# Patient Record
Sex: Female | Born: 1955 | Race: White | Hispanic: No | Marital: Married | Smoking: Never smoker
Health system: Southern US, Community
[De-identification: ages and names within clinical notes are randomized; demographics above are authoritative.]

## PROBLEM LIST (undated history)

## (undated) DIAGNOSIS — G43909 Migraine, unspecified, not intractable, without status migrainosus: Secondary | ICD-10-CM

## (undated) DIAGNOSIS — G20 Parkinson's disease: Secondary | ICD-10-CM

## (undated) DIAGNOSIS — G20A1 Parkinson's disease without dyskinesia, without mention of fluctuations: Secondary | ICD-10-CM

## (undated) HISTORY — PX: TONSILLECTOMY: SUR1361

## (undated) HISTORY — DX: Parkinson's disease without dyskinesia, without mention of fluctuations: G20.A1

## (undated) HISTORY — DX: Parkinson's disease: G20

## (undated) HISTORY — PX: BASAL CELL CARCINOMA EXCISION: SHX1214

## (undated) HISTORY — PX: APPENDECTOMY: SHX54

## (undated) HISTORY — DX: Migraine, unspecified, not intractable, without status migrainosus: G43.909

---

## 1999-05-28 ENCOUNTER — Other Ambulatory Visit: Admission: RE | Admit: 1999-05-28 | Discharge: 1999-05-28 | Payer: Self-pay | Admitting: Obstetrics and Gynecology

## 2000-05-28 ENCOUNTER — Other Ambulatory Visit: Admission: RE | Admit: 2000-05-28 | Discharge: 2000-05-28 | Payer: Self-pay | Admitting: Obstetrics and Gynecology

## 2000-06-01 ENCOUNTER — Encounter: Payer: Self-pay | Admitting: Obstetrics and Gynecology

## 2000-06-01 ENCOUNTER — Encounter: Admission: RE | Admit: 2000-06-01 | Discharge: 2000-06-01 | Payer: Self-pay | Admitting: Obstetrics and Gynecology

## 2000-06-05 ENCOUNTER — Encounter: Payer: Self-pay | Admitting: Obstetrics and Gynecology

## 2000-06-05 ENCOUNTER — Encounter: Admission: RE | Admit: 2000-06-05 | Discharge: 2000-06-05 | Payer: Self-pay | Admitting: Obstetrics and Gynecology

## 2001-06-07 ENCOUNTER — Other Ambulatory Visit: Admission: RE | Admit: 2001-06-07 | Discharge: 2001-06-07 | Payer: Self-pay | Admitting: Obstetrics and Gynecology

## 2001-06-09 ENCOUNTER — Encounter: Payer: Self-pay | Admitting: Obstetrics and Gynecology

## 2001-06-09 ENCOUNTER — Encounter: Admission: RE | Admit: 2001-06-09 | Discharge: 2001-06-09 | Payer: Self-pay | Admitting: Obstetrics and Gynecology

## 2002-02-23 ENCOUNTER — Encounter: Payer: Self-pay | Admitting: Obstetrics and Gynecology

## 2002-02-23 ENCOUNTER — Encounter: Admission: RE | Admit: 2002-02-23 | Discharge: 2002-02-23 | Payer: Self-pay | Admitting: Obstetrics and Gynecology

## 2003-03-08 ENCOUNTER — Other Ambulatory Visit: Admission: RE | Admit: 2003-03-08 | Discharge: 2003-03-08 | Payer: Self-pay | Admitting: Obstetrics and Gynecology

## 2003-10-18 ENCOUNTER — Encounter: Admission: RE | Admit: 2003-10-18 | Discharge: 2003-10-18 | Payer: Self-pay | Admitting: Obstetrics and Gynecology

## 2004-09-27 ENCOUNTER — Other Ambulatory Visit: Admission: RE | Admit: 2004-09-27 | Discharge: 2004-09-27 | Payer: Self-pay | Admitting: Obstetrics and Gynecology

## 2004-10-18 ENCOUNTER — Encounter: Admission: RE | Admit: 2004-10-18 | Discharge: 2004-10-18 | Payer: Self-pay | Admitting: Obstetrics and Gynecology

## 2005-10-01 ENCOUNTER — Other Ambulatory Visit: Admission: RE | Admit: 2005-10-01 | Discharge: 2005-10-01 | Payer: Self-pay | Admitting: Obstetrics and Gynecology

## 2005-11-18 ENCOUNTER — Encounter: Admission: RE | Admit: 2005-11-18 | Discharge: 2005-11-18 | Payer: Self-pay | Admitting: Obstetrics and Gynecology

## 2005-12-09 ENCOUNTER — Ambulatory Visit: Payer: Self-pay | Admitting: Gastroenterology

## 2005-12-24 ENCOUNTER — Ambulatory Visit: Payer: Self-pay | Admitting: Gastroenterology

## 2006-11-23 ENCOUNTER — Encounter: Admission: RE | Admit: 2006-11-23 | Discharge: 2006-11-23 | Payer: Self-pay | Admitting: Obstetrics and Gynecology

## 2006-11-25 ENCOUNTER — Emergency Department (HOSPITAL_COMMUNITY): Admission: EM | Admit: 2006-11-25 | Discharge: 2006-11-25 | Payer: Self-pay | Admitting: Emergency Medicine

## 2006-12-07 ENCOUNTER — Encounter: Admission: RE | Admit: 2006-12-07 | Discharge: 2006-12-07 | Payer: Self-pay | Admitting: Obstetrics and Gynecology

## 2007-11-25 ENCOUNTER — Encounter: Admission: RE | Admit: 2007-11-25 | Discharge: 2007-11-25 | Payer: Self-pay | Admitting: Obstetrics and Gynecology

## 2007-12-02 ENCOUNTER — Encounter: Admission: RE | Admit: 2007-12-02 | Discharge: 2007-12-02 | Payer: Self-pay | Admitting: Obstetrics and Gynecology

## 2008-11-27 ENCOUNTER — Encounter: Admission: RE | Admit: 2008-11-27 | Discharge: 2008-11-27 | Payer: Self-pay | Admitting: Obstetrics and Gynecology

## 2008-11-30 ENCOUNTER — Encounter: Admission: RE | Admit: 2008-11-30 | Discharge: 2008-11-30 | Payer: Self-pay | Admitting: Obstetrics and Gynecology

## 2010-01-23 ENCOUNTER — Encounter: Admission: RE | Admit: 2010-01-23 | Discharge: 2010-01-23 | Payer: Self-pay | Admitting: Obstetrics and Gynecology

## 2010-11-17 ENCOUNTER — Encounter: Payer: Self-pay | Admitting: Obstetrics and Gynecology

## 2010-12-23 ENCOUNTER — Other Ambulatory Visit: Payer: Self-pay | Admitting: Obstetrics and Gynecology

## 2010-12-23 DIAGNOSIS — Z1231 Encounter for screening mammogram for malignant neoplasm of breast: Secondary | ICD-10-CM

## 2011-02-17 ENCOUNTER — Ambulatory Visit
Admission: RE | Admit: 2011-02-17 | Discharge: 2011-02-17 | Disposition: A | Discharge status: Home / Self Care | Payer: 59 | Source: Ambulatory Visit | Attending: Obstetrics and Gynecology | Admitting: Obstetrics and Gynecology

## 2011-02-17 DIAGNOSIS — Z1231 Encounter for screening mammogram for malignant neoplasm of breast: Secondary | ICD-10-CM

## 2012-01-22 ENCOUNTER — Other Ambulatory Visit: Payer: Self-pay | Admitting: Obstetrics and Gynecology

## 2012-01-22 DIAGNOSIS — Z1231 Encounter for screening mammogram for malignant neoplasm of breast: Secondary | ICD-10-CM

## 2012-04-06 ENCOUNTER — Ambulatory Visit
Admission: RE | Admit: 2012-04-06 | Discharge: 2012-04-06 | Disposition: A | Discharge status: Home / Self Care | Payer: BC Managed Care – PPO | Source: Ambulatory Visit | Attending: Obstetrics and Gynecology | Admitting: Obstetrics and Gynecology

## 2012-04-06 DIAGNOSIS — Z1231 Encounter for screening mammogram for malignant neoplasm of breast: Secondary | ICD-10-CM

## 2015-12-26 ENCOUNTER — Encounter: Payer: Self-pay | Admitting: Internal Medicine

## 2016-02-05 DIAGNOSIS — H2513 Age-related nuclear cataract, bilateral: Secondary | ICD-10-CM | POA: Insufficient documentation

## 2016-07-30 LAB — HIV ANTIBODY (ROUTINE TESTING W REFLEX): HIV: NONREACTIVE

## 2016-09-01 DIAGNOSIS — Z23 Encounter for immunization: Secondary | ICD-10-CM | POA: Diagnosis not present

## 2017-02-17 DIAGNOSIS — H5213 Myopia, bilateral: Secondary | ICD-10-CM | POA: Insufficient documentation

## 2017-02-17 DIAGNOSIS — H524 Presbyopia: Secondary | ICD-10-CM | POA: Insufficient documentation

## 2017-02-17 DIAGNOSIS — H52203 Unspecified astigmatism, bilateral: Secondary | ICD-10-CM | POA: Insufficient documentation

## 2017-09-02 LAB — CBC AND DIFFERENTIAL
HEMATOCRIT: 41 (ref 36–46)
Hemoglobin: 13.3 (ref 12.0–16.0)
NEUTROS ABS: 33
PLATELETS: 242 (ref 150–399)
WBC: 5.1

## 2017-09-02 LAB — BASIC METABOLIC PANEL
BUN: 12 (ref 4–21)
Creatinine: 0.8 (ref 0.5–1.1)
GLUCOSE: 93
POTASSIUM: 4 (ref 3.4–5.3)
SODIUM: 144 (ref 137–147)

## 2017-09-02 LAB — LIPID PANEL
CHOLESTEROL: 256 — AB (ref 0–200)
HDL: 66 (ref 35–70)
LDL CALC: 159
TRIGLYCERIDES: 154 (ref 40–160)

## 2017-09-02 LAB — VITAMIN D 25 HYDROXY (VIT D DEFICIENCY, FRACTURES): Vit D, 25-Hydroxy: 36.5

## 2017-09-02 LAB — HEPATIC FUNCTION PANEL
AST: 19 (ref 13–35)
Alkaline Phosphatase: 72 (ref 25–125)
Bilirubin, Total: 0.5

## 2017-09-02 LAB — TSH: TSH: 3.74 (ref 0.41–5.90)

## 2017-11-02 DIAGNOSIS — Z1231 Encounter for screening mammogram for malignant neoplasm of breast: Secondary | ICD-10-CM | POA: Diagnosis not present

## 2017-11-02 DIAGNOSIS — Z780 Asymptomatic menopausal state: Secondary | ICD-10-CM | POA: Diagnosis not present

## 2017-11-02 LAB — HM MAMMOGRAPHY

## 2017-11-17 DIAGNOSIS — G20 Parkinson's disease: Secondary | ICD-10-CM | POA: Diagnosis not present

## 2018-04-13 ENCOUNTER — Encounter: Payer: Self-pay | Admitting: Neurology

## 2018-05-05 ENCOUNTER — Encounter: Payer: Self-pay | Admitting: Neurology

## 2018-05-11 NOTE — Progress Notes (Signed)
Kristi Stark was seen today in the movement disorders clinic for neurologic consultation at the request of Rolena Infante, MD.  The consultation is for the evaluation of Parkinson's disease.  Records have been reviewed that are available to me.  The patient first saw Dr. Adria Dill on May 21, 2017.  The patient's symptoms started in approximately March, 2018 with left hand tremor and decreased range of motion in the left arm and left shoulder.  She initially saw orthopedics and was diagnosed with a frozen shoulder.  Upon her history and examination with Dr. 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  The patient did not want to be on any medication.  She followed up with Dr. Adria Dill in January, 2019.  She was exercising very faithfully and felt that she was doing much better.   Specific Symptoms:  Tremor: Yes.  , L hand only Family hx of similar:  No. Voice: no change Sleep: sleeping well  Vivid Dreams:  No.  Acting out dreams:  No. Wet Pillows: No. Postural symptoms:  No.  Falls?  No. Bradykinesia symptoms: no bradykinesia noted Loss of smell:   "never been great" Loss of taste:  No. Urinary Incontinence:  No. Difficulty Swallowing:  No. Handwriting, micrographia: No. Trouble with ADL's:  No.  Trouble buttoning clothing: No. Depression:  No. Memory changes:  No. Hallucinations:  No.  visual distortions: No. N/V:  No. Lightheaded:  No.  Syncope: No. Diplopia:  No. Dyskinesia:  No.  Neuroimaging of the brain has not previously been performed.    PREVIOUS MEDICATIONS: none to date  ALLERGIES:  No Known Allergies  CURRENT MEDICATIONS:  No outpatient encounter medications on file as of 05/14/2018.   No facility-administered encounter medications on file as of 05/14/2018.     PAST MEDICAL HISTORY:   Past Medical History:  Diagnosis Date  . Migraine   . Parkinson's disease (HCC)     PAST SURGICAL HISTORY:   Past Surgical  History:  Procedure Laterality Date  . BASAL CELL CARCINOMA EXCISION      SOCIAL HISTORY:   Social History   Socioeconomic History  . Marital status: Married    Spouse name: Not on file  . Number of children: Not on file  . Years of education: Not on file  . Highest education level: Not on file  Occupational History  . Not on file  Social Needs  . Financial resource strain: Not on file  . Food insecurity:    Worry: Not on file    Inability: Not on file  . Transportation needs:    Medical: Not on file    Non-medical: Not on file  Tobacco Use  . Smoking status: Never Smoker  . Smokeless tobacco: Never Used  Substance and Sexual Activity  . Alcohol use: Yes    Comment: wine every 2 weeks  . Drug use: Never  . Sexual activity: Not on file  Lifestyle  . Physical activity:    Days per week: Not on file    Minutes per session: Not on file  . Stress: Not on file  Relationships  . Social connections:    Talks on phone: Not on file    Gets together: Not on file    Attends religious service: Not on file    Active member of club or organization: Not on file    Attends meetings of clubs or organizations: Not on file    Relationship status: Not on file  .  Intimate partner violence:    Fear of current or ex partner: Not on file    Emotionally abused: Not on file    Physically abused: Not on file    Forced sexual activity: Not on file  Other Topics Concern  . Not on file  Social History Narrative  . Not on file    FAMILY HISTORY:   Family Status  Relation Name Status  . Mother  Deceased  . Father  Deceased  . Sister  Alive  . Brother  Alive  . Daughter  Alive  . Son  Alive    ROS:  ROS  PHYSICAL EXAMINATION:    VITALS:   Vitals:   05/14/18 0942  BP: 112/60  Pulse: 60  SpO2: 98%  Weight: 141 lb (64 kg)  Height: 5\' 4"  (1.626 m)    GEN:  The patient appears stated age and is in NAD. HEENT:  Normocephalic, atraumatic.  The mucous membranes are moist. The  superficial temporal arteries are without ropiness or tenderness. CV:  RRR Lungs:  CTAB Neck/HEME:  There are no carotid bruits bilaterally.  Neurological examination:  Orientation: The patient is alert and oriented x3. Fund of knowledge is appropriate.  Recent and remote memory are intact.  Attention and concentration are normal.    Able to name objects and repeat phrases. Cranial nerves: There is good facial symmetry.  There is mild facial hypomimia.  Pupils are equal round and reactive to light bilaterally. Fundoscopic exam reveals clear margins bilaterally. Extraocular muscles are intact. The visual fields are full to confrontational testing. The speech is fluent and clear. Soft palate rises symmetrically and there is no tongue deviation. Hearing is intact to conversational tone. Sensation: Sensation is intact to light and pinprick throughout (facial, trunk, extremities). Vibration is intact at the bilateral big toe. There is no extinction with double simultaneous stimulation. There is no sensory dermatomal level identified. Motor: Strength is 5/5 in the bilateral upper and lower extremities.   Shoulder shrug is equal and symmetric.  There is no pronator drift. Deep tendon reflexes: Deep tendon reflexes are 2/4 at the bilateral biceps, triceps, brachioradialis, patella and achilles. Plantar responses are downgoing bilaterally.  Movement examination: Tone: There is mild increased tone in the left upper extremity.  Tone elsewhere is normal. Abnormal movements: Left upper extremity resting tremor is noted.  This increases with distraction procedures.  There is mouth movement when distracted. Coordination:  There is mild decremation with RAM's, seen with finger taps and hand opening and closing on the left.  All other rapid alternating movements are normal. Gait and Station: The patient has no difficulty arising out of a deep-seated chair without the use of the hands. The patient's stride length is  normal.  The patient has a negative pull test.      ASSESSMENT/PLAN:  1.  Idiopathic Parkinson's disease.  Patient was diagnosed in July, 2018 with symptoms since March, 2018.  -We discussed the diagnosis as well as pathophysiology of the disease.  We discussed treatment options as well as prognostic indicators.  Patient education was provided.  -Greater than 50% of the 60 minute visit was spent in counseling answering questions and talking about what to expect now as well as in the future.  We talked about medication options as well as potential future surgical options.  We talked about safety in the home.  -We discussed various medications.  She really does not want medication right now.  -We discussed community resources in the  area including patient support groups and community exercise programs for PD and pt education was provided to the patient.  I think she would fit and well with thrive community group and we will introduce her to this.  I will have my social worker call her and introduce herself Monday as she was out of the office today.   2.  Hx of BCC  -has appt with [place] [facility].  -We discussed that it used to be believed that levodopa would increased risk of melanoma, but it is now believed that Parkinson's itself likely increases risk of melanoma.  3.  Patient currently has no primary care physician since she has just moved back to the area.  She was given phone numbers and information to current primary care physicians that may be taking new patients.  4.  F/u 5-6 months.  Cc:  Patient, No Pcp Per

## 2018-05-14 ENCOUNTER — Encounter: Payer: Self-pay | Admitting: Neurology

## 2018-05-14 ENCOUNTER — Ambulatory Visit (INDEPENDENT_AMBULATORY_CARE_PROVIDER_SITE_OTHER): Payer: BLUE CROSS/BLUE SHIELD | Admitting: Neurology

## 2018-05-14 VITALS — BP 112/60 | HR 60 | Ht 64.0 in | Wt 141.0 lb

## 2018-05-14 DIAGNOSIS — G20 Parkinson's disease: Secondary | ICD-10-CM | POA: Diagnosis not present

## 2018-05-14 NOTE — Patient Instructions (Addendum)
Neena RhymesKatherine Tabori or Asencion PartridgeCamille Andy can be reached at [phone] Guardian Life InsuranceSteven Hunter [phone] Ruthe Mannanalia Aron [phone]

## 2018-05-17 ENCOUNTER — Telehealth: Payer: Self-pay | Admitting: Psychology

## 2018-05-17 NOTE — Telephone Encounter (Signed)
TC to patient to let her know about the THR[employer] group. She seemed uncomfortable with me passing along her info to [place], so I provided her with information to contact [place] directly.

## 2018-07-01 ENCOUNTER — Ambulatory Visit (INDEPENDENT_AMBULATORY_CARE_PROVIDER_SITE_OTHER): Payer: BLUE CROSS/BLUE SHIELD | Admitting: Family Medicine

## 2018-07-01 ENCOUNTER — Other Ambulatory Visit: Payer: Self-pay

## 2018-07-01 ENCOUNTER — Encounter: Payer: Self-pay | Admitting: Family Medicine

## 2018-07-01 VITALS — BP 110/78 | HR 52 | Temp 98.1°F | Resp 15 | Ht 63.0 in | Wt 141.2 lb

## 2018-07-01 DIAGNOSIS — G43809 Other migraine, not intractable, without status migrainosus: Secondary | ICD-10-CM | POA: Diagnosis not present

## 2018-07-01 DIAGNOSIS — E785 Hyperlipidemia, unspecified: Secondary | ICD-10-CM | POA: Diagnosis not present

## 2018-07-01 DIAGNOSIS — G43909 Migraine, unspecified, not intractable, without status migrainosus: Secondary | ICD-10-CM | POA: Insufficient documentation

## 2018-07-01 DIAGNOSIS — G20 Parkinson's disease: Secondary | ICD-10-CM | POA: Diagnosis not present

## 2018-07-01 NOTE — Assessment & Plan Note (Signed)
New to provider.  Ongoing for pt.  sxs are well controlled w/ Maxalt on the rare occasion they develop.  No need for prophylaxis given that she is only having 2-3/yr.  Will continue to assess this going forward if need be.

## 2018-07-01 NOTE — Progress Notes (Signed)
   Subjective:    Patient ID: Kristi Stark, female    DOB: 12-14-1955, 62 y.o.   MRN: [id number]  HPI New to establish.  Previous MD- [facility] in [place]  Parkinson's- seeing Dr Tat, not currently on medication.  She is exercising regularly and following a healthy diet.  Seeing an acupuncturist. Hoping to delay medication as long as possible.  Tremor isolated to L hand.  R hand dominant.  Migraines- pt having 2-3/year.  Will take Maxalt prn.  Has found acupuncture to be very helpful.  Hyperlipidemia- ongoing issue.  On Red Yeast Rice daily.  Exercising daily- elliptical, treadmill, bike, walking.  Health Maintenance- UTD on colonoscopy (2018), mammo, pap.   Review of Systems For ROS see HPI     Objective:   Physical Exam  Constitutional: She is oriented to person, place, and time. She appears well-developed and well-nourished. No distress.  HENT:  Head: Normocephalic and atraumatic.  Eyes: Pupils are equal, round, and reactive to light. Conjunctivae and EOM are normal.  Neck: Normal range of motion. Neck supple. No thyromegaly present.  Cardiovascular: Normal rate, regular rhythm, normal heart sounds and intact distal pulses.  No murmur heard. Pulmonary/Chest: Effort normal and breath sounds normal. No respiratory distress.  Abdominal: Soft. She exhibits no distension. There is no tenderness.  Musculoskeletal: She exhibits no edema.  Lymphadenopathy:    She has no cervical adenopathy.  Neurological: She is alert and oriented to person, place, and time.  Fine, pill rolling tremor of L hand  Skin: Skin is warm and dry.  Psychiatric: She has a normal mood and affect. Her behavior is normal.  Vitals reviewed.         Assessment & Plan:

## 2018-07-01 NOTE — Assessment & Plan Note (Signed)
New to provider, ongoing for pt.  On Red Yeast Rice daily.  Exercising regularly.  Applauded her efforts.  Check labs.  Adjust meds prn

## 2018-07-01 NOTE — Patient Instructions (Signed)
Schedule your complete physical in 6 months We'll notify you of your lab results and make any changes if needed Keep up the good work on healthy diet and regular exercise- you look great!!! Call with any questions or concerns Welcome!  We're glad to have you!!! 

## 2018-07-01 NOTE — Assessment & Plan Note (Signed)
New to provider, ongoing for pt.  Not currently on any medication.  Following w/ Dr Arbutus Leas.  Will continue to follow along.

## 2018-07-02 LAB — HEPATIC FUNCTION PANEL
ALBUMIN: 4.6 g/dL (ref 3.5–5.2)
ALT: 15 U/L (ref 0–35)
AST: 23 U/L (ref 0–37)
Alkaline Phosphatase: 82 U/L (ref 39–117)
Bilirubin, Direct: 0.1 mg/dL (ref 0.0–0.3)
TOTAL PROTEIN: 7 g/dL (ref 6.0–8.3)
Total Bilirubin: 0.5 mg/dL (ref 0.2–1.2)

## 2018-07-02 LAB — CBC WITH DIFFERENTIAL/PLATELET
BASOS ABS: 0 10*3/uL (ref 0.0–0.1)
Basophils Relative: 0.4 % (ref 0.0–3.0)
EOS PCT: 3.4 % (ref 0.0–5.0)
Eosinophils Absolute: 0.2 10*3/uL (ref 0.0–0.7)
HCT: 38.8 % (ref 36.0–46.0)
HEMOGLOBIN: 13.3 g/dL (ref 12.0–15.0)
Lymphocytes Relative: 49.4 % — ABNORMAL HIGH (ref 12.0–46.0)
Lymphs Abs: 3.4 10*3/uL (ref 0.7–4.0)
MCHC: 34.3 g/dL (ref 30.0–36.0)
MCV: 94.3 fl (ref 78.0–100.0)
MONO ABS: 0.5 10*3/uL (ref 0.1–1.0)
Monocytes Relative: 7.4 % (ref 3.0–12.0)
Neutro Abs: 2.7 10*3/uL (ref 1.4–7.7)
Neutrophils Relative %: 39.4 % — ABNORMAL LOW (ref 43.0–77.0)
Platelets: 224 10*3/uL (ref 150.0–400.0)
RBC: 4.12 Mil/uL (ref 3.87–5.11)
RDW: 12.4 % (ref 11.5–15.5)
WBC: 6.9 10*3/uL (ref 4.0–10.5)

## 2018-07-02 LAB — LIPID PANEL
CHOL/HDL RATIO: 3
CHOLESTEROL: 242 mg/dL — AB (ref 0–200)
HDL: 78.2 mg/dL (ref >39.00)
NonHDL: 163.81
TRIGLYCERIDES: 347 mg/dL — AB (ref 0.0–149.0)
VLDL: 69.4 mg/dL — AB (ref 0.0–40.0)

## 2018-07-02 LAB — BASIC METABOLIC PANEL
BUN: 18 mg/dL (ref 6–23)
CO2: 30 mEq/L (ref 19–32)
CREATININE: 0.84 mg/dL (ref 0.40–1.20)
Calcium: 10 mg/dL (ref 8.4–10.5)
Chloride: 105 mEq/L (ref 96–112)
GFR: 72.79 mL/min (ref >60.00)
Glucose, Bld: 93 mg/dL (ref 70–99)
Potassium: 4.1 mEq/L (ref 3.5–5.1)
Sodium: 143 mEq/L (ref 135–145)

## 2018-07-02 LAB — LDL CHOLESTEROL, DIRECT: LDL DIRECT: 140 mg/dL

## 2018-07-02 LAB — TSH: TSH: 3.02 u[IU]/mL (ref 0.35–4.50)

## 2018-08-03 ENCOUNTER — Encounter: Payer: Self-pay | Admitting: General Practice

## 2018-08-11 ENCOUNTER — Ambulatory Visit (INDEPENDENT_AMBULATORY_CARE_PROVIDER_SITE_OTHER): Payer: BLUE CROSS/BLUE SHIELD

## 2018-08-11 DIAGNOSIS — Z23 Encounter for immunization: Secondary | ICD-10-CM | POA: Diagnosis not present

## 2018-10-12 NOTE — Progress Notes (Signed)
Kristi Stark was seen today in the movement disorders clinic for follow up of Parkinson's disease.  She is accompanied by her husband who supplements the history.  She is on no medication, which is by her choice.    She reports that she has been stable since our last visit.  She has had no falls.  No lightheadedness or near syncope.  Her mood has been good.  Records are reviewed since our last visit.  She is now seeing Dr. Beverely Lowabori.  I have reviewed those records.  [facility].  She [facility] [employer] and she loves that.  She also is [facility] class.   PREVIOUS MEDICATIONS: none to date  ALLERGIES:  No Known Allergies  CURRENT MEDICATIONS:  Outpatient Encounter Medications as of 10/14/2018  Medication Sig  . Acetylcysteine (NAC PO) Take by mouth daily.  . Medium Chain Triglycerides (MCT OIL PO) Take by mouth every other day.  . Multiple Vitamins-Minerals (WOMENS MULTIVITAMIN PO) Take by mouth daily.  . Omega-3 1000 MG CAPS Take by mouth daily.  . Probiotic Product (PROBIOTIC-10 PO) Take by mouth daily.  . Red Yeast Rice Extract (RED YEAST RICE PO) Take by mouth daily.  . rizatriptan (MAXALT-MLT) 5 MG disintegrating tablet PRN   No facility-administered encounter medications on file as of 10/14/2018.     PAST MEDICAL HISTORY:   Past Medical History:  Diagnosis Date  . Migraine   . Parkinson's disease (HCC)     PAST SURGICAL HISTORY:   Past Surgical History:  Procedure Laterality Date  . APPENDECTOMY    . BASAL CELL CARCINOMA EXCISION    . TONSILLECTOMY      SOCIAL HISTORY:   Social History   Socioeconomic History  . Marital status: Married    Spouse name: Not on file  . Number of children: Not on file  . Years of education: Not on file  . Highest education level: Not on file  Occupational History  . Not on file  Social Needs  . Financial resource strain: Not on file   . Food insecurity:    Worry: Not on file    Inability: Not on file  . Transportation needs:    Medical: Not on file    Non-medical: Not on file  Tobacco Use  . Smoking status: Never Smoker  . Smokeless tobacco: Never Used  Substance and Sexual Activity  . Alcohol use: Yes    Comment: wine every 2 weeks  . Drug use: Never  . Sexual activity: Yes  Lifestyle  . Physical activity:    Days per week: Not on file    Minutes per session: Not on file  . Stress: Not on file  Relationships  . Social connections:    Talks on phone: Not on file    Gets together: Not on file    Attends religious service: Not on file    Active member of club or organization: Not on file    Attends meetings of clubs or organizations: Not on file    Relationship status: Not on file  . Intimate partner violence:    Fear of current or ex partner: Not on file    Emotionally abused: Not on file    Physically abused: Not on file    Forced sexual activity: Not on file  Other Topics Concern  . Not on file  Social History Narrative  .  Not on file    FAMILY HISTORY:   Family Status  Relation Name Status  . Mother  Deceased  . Father  Deceased  . Sister  Alive  . Brother  Alive  . Daughter  Alive  . Son  Alive    ROS:  Review of Systems  Constitutional: Negative.   HENT: Negative.   Eyes: Negative.   Respiratory: Negative.   Cardiovascular: Negative.   Gastrointestinal: Negative.   Genitourinary: Negative.   Musculoskeletal: Negative.   Skin: Negative.     PHYSICAL EXAMINATION:    VITALS:   Vitals:   10/14/18 1419  BP: 118/62  Pulse: 64  SpO2: 99%  Weight: 142 lb (64.4 kg)  Height: 5' 3.5" (1.613 m)    GEN:  The patient appears stated age and is in NAD. HEENT:  Normocephalic, atraumatic.  The mucous membranes are moist. The superficial temporal arteries are without ropiness or tenderness. CV:  RRR Lungs:  CTAB Neck/HEME:  There are no carotid bruits bilaterally.  Neurological  examination:  Orientation: The patient is alert and oriented x3. Cranial nerves: There is good facial symmetry. The speech is fluent and clear. Soft palate rises symmetrically and there is no tongue deviation. Hearing is intact to conversational tone. Sensation: Sensation is intact to light touch throughout Motor: Strength is 5/5 in the bilateral upper and lower extremities.   Shoulder shrug is equal and symmetric.  There is no pronator drift.  Movement examination: Tone: There is mild increased tone with activation procedures in the left upper extremity Abnormal movements: Left upper extremity resting tremor is noted.  This increases with distraction procedures.   Coordination:  There is mild decremation with RAM's, seen with finger taps and toe taps on the left. Gait and Station: The patient has no difficulty arising out of a deep-seated chair without the use of the hands. The patient's stride length is normal.  The patient has a negative pull test.        Chemistry      Component Value Date/Time   NA 143 07/01/2018 1611   NA 144 09/02/2017   K 4.1 07/01/2018 1611   CL 105 07/01/2018 1611   CO2 30 07/01/2018 1611   BUN 18 07/01/2018 1611   BUN 12 09/02/2017   CREATININE 0.84 07/01/2018 1611   GLU 93 09/02/2017      Component Value Date/Time   CALCIUM 10.0 07/01/2018 1611   ALKPHOS 82 07/01/2018 1611   AST 23 07/01/2018 1611   ALT 15 07/01/2018 1611   BILITOT 0.5 07/01/2018 1611     Lab Results  Component Value Date   TSH 3.02 07/01/2018     ASSESSMENT/PLAN:  1.  Idiopathic Parkinson's disease.  Patient was diagnosed in July, 2018 with symptoms since March, 2018.  -We discussed the diagnosis as well as pathophysiology of the disease.  We discussed treatment options as well as prognostic indicators.  Patient education was provided.  -She is currently participating in the [facility] group as well as the [employer] cycle class and I congratulated her.  -We discussed various  medications, but ultimately decided to hold off on those given that she is doing well.   2.  Hx of BCC  -has appt with [place] [facility] in January.  -We discussed that it used to be believed that levodopa would increased risk of melanoma, but it is now believed that Parkinson's itself likely increases risk of melanoma.  3.  I will see her back in 6 months,  sooner should new neurologic issues arise.  Cc:  Sheliah Hatch, MD

## 2018-10-14 ENCOUNTER — Ambulatory Visit (INDEPENDENT_AMBULATORY_CARE_PROVIDER_SITE_OTHER): Payer: BLUE CROSS/BLUE SHIELD | Admitting: Neurology

## 2018-10-14 ENCOUNTER — Encounter: Payer: Self-pay | Admitting: Neurology

## 2018-10-14 VITALS — BP 118/62 | HR 64 | Ht 63.5 in | Wt 142.0 lb

## 2018-10-14 DIAGNOSIS — G20 Parkinson's disease: Secondary | ICD-10-CM

## 2018-11-03 DIAGNOSIS — D225 Melanocytic nevi of trunk: Secondary | ICD-10-CM | POA: Diagnosis not present

## 2018-11-03 DIAGNOSIS — D2239 Melanocytic nevi of other parts of face: Secondary | ICD-10-CM | POA: Diagnosis not present

## 2018-11-03 DIAGNOSIS — D2261 Melanocytic nevi of right upper limb, including shoulder: Secondary | ICD-10-CM | POA: Diagnosis not present

## 2018-11-03 DIAGNOSIS — D2262 Melanocytic nevi of left upper limb, including shoulder: Secondary | ICD-10-CM | POA: Diagnosis not present

## 2018-12-28 ENCOUNTER — Encounter: Payer: Self-pay | Admitting: Family Medicine

## 2018-12-28 ENCOUNTER — Ambulatory Visit (INDEPENDENT_AMBULATORY_CARE_PROVIDER_SITE_OTHER): Payer: BLUE CROSS/BLUE SHIELD | Admitting: Family Medicine

## 2018-12-28 ENCOUNTER — Other Ambulatory Visit: Payer: Self-pay

## 2018-12-28 VITALS — BP 108/62 | HR 56 | Temp 98.3°F | Resp 14 | Ht 64.0 in | Wt 140.4 lb

## 2018-12-28 DIAGNOSIS — Z23 Encounter for immunization: Secondary | ICD-10-CM | POA: Diagnosis not present

## 2018-12-28 DIAGNOSIS — Z1231 Encounter for screening mammogram for malignant neoplasm of breast: Secondary | ICD-10-CM

## 2018-12-28 DIAGNOSIS — G20A1 Parkinson's disease without dyskinesia, without mention of fluctuations: Secondary | ICD-10-CM

## 2018-12-28 DIAGNOSIS — G20 Parkinson's disease: Secondary | ICD-10-CM

## 2018-12-28 DIAGNOSIS — Z0000 Encounter for general adult medical examination without abnormal findings: Secondary | ICD-10-CM

## 2018-12-28 DIAGNOSIS — E785 Hyperlipidemia, unspecified: Secondary | ICD-10-CM | POA: Diagnosis not present

## 2018-12-28 NOTE — Progress Notes (Signed)
   Subjective:    Patient ID: Kristi Stark, female    DOB: 09-Aug-1956, 63 y.o.   MRN: [id number]  HPI CPE- UTD on colonoscopy, pap, flu shot.  Due for Tdap.  Due for mammo.  'I feel great'   Review of Systems Patient reports no vision/ hearing changes, adenopathy,fever, weight change,  persistant/recurrent hoarseness , swallowing issues, chest pain, palpitations, edema, persistant/recurrent cough, hemoptysis, dyspnea (rest/exertional/paroxysmal nocturnal), gastrointestinal bleeding (melena, rectal bleeding), abdominal pain, significant heartburn, bowel changes, GU symptoms (dysuria, hematuria, incontinence), Gyn symptoms (abnormal  bleeding, pain),  syncope, focal weakness, memory loss, numbness & tingling, skin/hair/nail changes, abnormal bruising or bleeding, anxiety, or depression.     Objective:   Physical Exam General Appearance:    Alert, cooperative, no distress, appears stated age  Head:    Normocephalic, without obvious abnormality, atraumatic  Eyes:    PERRL, conjunctiva/corneas clear, EOM's intact, fundi    benign, both eyes  Ears:    Normal TM's and external ear canals, both ears  Nose:   Nares normal, septum midline, mucosa normal, no drainage    or sinus tenderness  Throat:   Lips, mucosa, and tongue normal; teeth and gums normal  Neck:   Supple, symmetrical, trachea midline, no adenopathy;    Thyroid: no enlargement/tenderness/nodules  Back:     Symmetric, no curvature, ROM normal, no CVA tenderness  Lungs:     Clear to auscultation bilaterally, respirations unlabored  Chest Wall:    No tenderness or deformity   Heart:    Regular rate and rhythm, S1 and S2 normal, no murmur, rub   or gallop  Breast Exam:    Deferred to mammo  Abdomen:     Soft, non-tender, bowel sounds active all four quadrants,    no masses, no organomegaly  Genitalia:    Deferred to GYN  Rectal:    Extremities:   Extremities normal, atraumatic, no cyanosis or edema  Pulses:   2+ and symmetric all  extremities  Skin:   Skin color, texture, turgor normal, no rashes or lesions  Lymph nodes:   Cervical, supraclavicular, and axillary nodes normal  Neurologic:   CNII-XII intact, normal strength, sensation and reflexes    throughout          Assessment & Plan:

## 2018-12-28 NOTE — Addendum Note (Signed)
Addended by: Solon Augusta on: 12/28/2018 08:41 AM   Modules accepted: Orders

## 2018-12-28 NOTE — Assessment & Plan Note (Signed)
Chronic problem.  On Red Yeast Rice daily.  Applauded efforts at diet and exercise.  Check labs.  Adjust meds prn

## 2018-12-28 NOTE — Addendum Note (Signed)
Addended by: Laddie Aquas A on: 12/28/2018 11:57 AM   Modules accepted: Orders

## 2018-12-28 NOTE — Patient Instructions (Signed)
Follow up in 1 year or as needed We'll notify you of your lab results and make any changes if needed Keep up the good work on healthy diet and regular exercise- you look great!! We'll call you to schedule your mammo Call with any questions or concerns ENJOY YOUR TRIP!!!

## 2018-12-28 NOTE — Assessment & Plan Note (Signed)
Pt's PE WNL.  UTD on pap, colonoscopy.  Due for mammo- ordered.  Tdap given.  Check labs.  Anticipatory guidance provided.

## 2018-12-28 NOTE — Assessment & Plan Note (Signed)
Following w/ Dr Tat 

## 2018-12-29 LAB — CBC WITH DIFFERENTIAL/PLATELET
ABSOLUTE MONOCYTES: 360 {cells}/uL (ref 200–950)
BASOS PCT: 1 %
Basophils Absolute: 59 cells/uL (ref 0–200)
Eosinophils Absolute: 466 cells/uL (ref 15–500)
Eosinophils Relative: 7.9 %
HCT: 38.4 % (ref 35.0–45.0)
Hemoglobin: 13.2 g/dL (ref 11.7–15.5)
Lymphs Abs: 2643 cells/uL (ref 850–3900)
MCH: 32 pg (ref 27.0–33.0)
MCHC: 34.4 g/dL (ref 32.0–36.0)
MCV: 93 fL (ref 80.0–100.0)
MPV: 12.1 fL (ref 7.5–12.5)
Monocytes Relative: 6.1 %
Neutro Abs: 2372 cells/uL (ref 1500–7800)
Neutrophils Relative %: 40.2 %
Platelets: 238 10*3/uL (ref 140–400)
RBC: 4.13 10*6/uL (ref 3.80–5.10)
RDW: 11.4 % (ref 11.0–15.0)
Total Lymphocyte: 44.8 %
WBC: 5.9 10*3/uL (ref 3.8–10.8)

## 2018-12-29 LAB — HEPATIC FUNCTION PANEL
AG RATIO: 2 (calc) (ref 1.0–2.5)
ALT: 11 U/L (ref 6–29)
AST: 21 U/L (ref 10–35)
Albumin: 4.5 g/dL (ref 3.6–5.1)
Alkaline phosphatase (APISO): 70 U/L (ref 37–153)
Bilirubin, Direct: 0.1 mg/dL (ref 0.0–0.2)
GLOBULIN: 2.2 g/dL (ref 1.9–3.7)
Indirect Bilirubin: 0.7 mg/dL (calc) (ref 0.2–1.2)
Total Bilirubin: 0.8 mg/dL (ref 0.2–1.2)
Total Protein: 6.7 g/dL (ref 6.1–8.1)

## 2018-12-29 LAB — BASIC METABOLIC PANEL
BUN: 11 mg/dL (ref 7–25)
CO2: 25 mmol/L (ref 20–32)
CREATININE: 0.88 mg/dL (ref 0.50–0.99)
Calcium: 9.4 mg/dL (ref 8.6–10.4)
Chloride: 109 mmol/L (ref 98–110)
Glucose, Bld: 92 mg/dL (ref 65–99)
Potassium: 3.8 mmol/L (ref 3.5–5.3)
Sodium: 144 mmol/L (ref 135–146)

## 2018-12-29 LAB — TSH: TSH: 3.88 mIU/L (ref 0.40–4.50)

## 2018-12-29 LAB — LIPID PANEL
CHOL/HDL RATIO: 3 (calc) (ref <5.0)
Cholesterol: 228 mg/dL — ABNORMAL HIGH (ref <200)
HDL: 75 mg/dL (ref >=50)
LDL CHOLESTEROL (CALC): 131 mg/dL — AB
NON-HDL CHOLESTEROL (CALC): 153 mg/dL — AB (ref <130)
Triglycerides: 113 mg/dL (ref <150)

## 2018-12-30 ENCOUNTER — Encounter: Payer: Self-pay | Admitting: General Practice

## 2019-02-01 ENCOUNTER — Ambulatory Visit: Payer: BLUE CROSS/BLUE SHIELD

## 2019-03-09 DIAGNOSIS — H04123 Dry eye syndrome of bilateral lacrimal glands: Secondary | ICD-10-CM | POA: Diagnosis not present

## 2019-03-10 ENCOUNTER — Ambulatory Visit
Admission: RE | Admit: 2019-03-10 | Discharge: 2019-03-10 | Disposition: A | Discharge status: Home / Self Care | Payer: BLUE CROSS/BLUE SHIELD | Source: Ambulatory Visit | Attending: Family Medicine | Admitting: Family Medicine

## 2019-03-10 ENCOUNTER — Other Ambulatory Visit: Payer: Self-pay

## 2019-03-10 DIAGNOSIS — Z1231 Encounter for screening mammogram for malignant neoplasm of breast: Secondary | ICD-10-CM | POA: Diagnosis not present

## 2019-03-14 ENCOUNTER — Other Ambulatory Visit: Payer: Self-pay | Admitting: Family Medicine

## 2019-03-14 DIAGNOSIS — N6489 Other specified disorders of breast: Secondary | ICD-10-CM

## 2019-03-16 ENCOUNTER — Ambulatory Visit: Payer: BLUE CROSS/BLUE SHIELD

## 2019-04-01 ENCOUNTER — Ambulatory Visit: Payer: BLUE CROSS/BLUE SHIELD

## 2019-04-01 ENCOUNTER — Other Ambulatory Visit: Payer: Self-pay

## 2019-04-01 ENCOUNTER — Ambulatory Visit
Admission: RE | Admit: 2019-04-01 | Discharge: 2019-04-01 | Disposition: A | Discharge status: Home / Self Care | Payer: BC Managed Care – PPO | Source: Ambulatory Visit | Attending: Family Medicine | Admitting: Family Medicine

## 2019-04-01 ENCOUNTER — Encounter: Payer: Self-pay | Admitting: Neurology

## 2019-04-01 DIAGNOSIS — N6489 Other specified disorders of breast: Secondary | ICD-10-CM

## 2019-04-01 DIAGNOSIS — R922 Inconclusive mammogram: Secondary | ICD-10-CM | POA: Diagnosis not present

## 2019-04-15 ENCOUNTER — Ambulatory Visit: Payer: BLUE CROSS/BLUE SHIELD | Admitting: Neurology

## 2019-10-18 ENCOUNTER — Ambulatory Visit: Payer: BC Managed Care – PPO | Admitting: Neurology

## 2019-10-29 DIAGNOSIS — Z20828 Contact with and (suspected) exposure to other viral communicable diseases: Secondary | ICD-10-CM | POA: Diagnosis not present

## 2019-10-29 DIAGNOSIS — U071 COVID-19: Secondary | ICD-10-CM | POA: Diagnosis not present

## 2019-10-29 DIAGNOSIS — Z03818 Encounter for observation for suspected exposure to other biological agents ruled out: Secondary | ICD-10-CM | POA: Diagnosis not present

## 2019-11-06 DIAGNOSIS — Z20828 Contact with and (suspected) exposure to other viral communicable diseases: Secondary | ICD-10-CM | POA: Diagnosis not present

## 2019-11-06 DIAGNOSIS — U071 COVID-19: Secondary | ICD-10-CM | POA: Diagnosis not present

## 2019-11-13 DIAGNOSIS — Z20828 Contact with and (suspected) exposure to other viral communicable diseases: Secondary | ICD-10-CM | POA: Diagnosis not present

## 2019-11-13 DIAGNOSIS — Z03818 Encounter for observation for suspected exposure to other biological agents ruled out: Secondary | ICD-10-CM | POA: Diagnosis not present

## 2019-12-29 ENCOUNTER — Other Ambulatory Visit: Payer: Self-pay

## 2019-12-29 ENCOUNTER — Ambulatory Visit (INDEPENDENT_AMBULATORY_CARE_PROVIDER_SITE_OTHER): Payer: BC Managed Care – PPO | Admitting: Family Medicine

## 2019-12-29 ENCOUNTER — Encounter: Payer: Self-pay | Admitting: Family Medicine

## 2019-12-29 VITALS — BP 126/82 | HR 56 | Temp 97.8°F | Resp 16 | Ht 64.0 in | Wt 135.4 lb

## 2019-12-29 DIAGNOSIS — G20 Parkinson's disease: Secondary | ICD-10-CM | POA: Diagnosis not present

## 2019-12-29 DIAGNOSIS — Z0000 Encounter for general adult medical examination without abnormal findings: Secondary | ICD-10-CM | POA: Diagnosis not present

## 2019-12-29 DIAGNOSIS — E785 Hyperlipidemia, unspecified: Secondary | ICD-10-CM | POA: Diagnosis not present

## 2019-12-29 DIAGNOSIS — Z8616 Personal history of COVID-19: Secondary | ICD-10-CM

## 2019-12-29 LAB — CBC WITH DIFFERENTIAL/PLATELET
Basophils Absolute: 0.1 10*3/uL (ref 0.0–0.1)
Basophils Relative: 1 % (ref 0.0–3.0)
Eosinophils Absolute: 0.3 10*3/uL (ref 0.0–0.7)
Eosinophils Relative: 6 % — ABNORMAL HIGH (ref 0.0–5.0)
HCT: 41.1 % (ref 36.0–46.0)
Hemoglobin: 13.9 g/dL (ref 12.0–15.0)
Lymphocytes Relative: 44.7 % (ref 12.0–46.0)
Lymphs Abs: 2.5 10*3/uL (ref 0.7–4.0)
MCHC: 33.8 g/dL (ref 30.0–36.0)
MCV: 94.9 fl (ref 78.0–100.0)
Monocytes Absolute: 0.4 10*3/uL (ref 0.1–1.0)
Monocytes Relative: 7.9 % (ref 3.0–12.0)
Neutro Abs: 2.2 10*3/uL (ref 1.4–7.7)
Neutrophils Relative %: 40.4 % — ABNORMAL LOW (ref 43.0–77.0)
Platelets: 223 10*3/uL (ref 150.0–400.0)
RBC: 4.33 Mil/uL (ref 3.87–5.11)
RDW: 12.5 % (ref 11.5–15.5)
WBC: 5.5 10*3/uL (ref 4.0–10.5)

## 2019-12-29 LAB — HEPATIC FUNCTION PANEL
ALT: 15 U/L (ref 0–35)
AST: 26 U/L (ref 0–37)
Albumin: 4.4 g/dL (ref 3.5–5.2)
Alkaline Phosphatase: 74 U/L (ref 39–117)
Bilirubin, Direct: 0.1 mg/dL (ref 0.0–0.3)
Total Bilirubin: 0.8 mg/dL (ref 0.2–1.2)
Total Protein: 7.1 g/dL (ref 6.0–8.3)

## 2019-12-29 LAB — BASIC METABOLIC PANEL
BUN: 16 mg/dL (ref 6–23)
CO2: 28 mEq/L (ref 19–32)
Calcium: 10 mg/dL (ref 8.4–10.5)
Chloride: 105 mEq/L (ref 96–112)
Creatinine, Ser: 0.85 mg/dL (ref 0.40–1.20)
GFR: 67.23 mL/min (ref >60.00)
Glucose, Bld: 90 mg/dL (ref 70–99)
Potassium: 3.8 mEq/L (ref 3.5–5.1)
Sodium: 142 mEq/L (ref 135–145)

## 2019-12-29 LAB — LIPID PANEL
Cholesterol: 261 mg/dL — ABNORMAL HIGH (ref 0–200)
HDL: 67.1 mg/dL (ref >39.00)
LDL Cholesterol: 166 mg/dL — ABNORMAL HIGH (ref 0–99)
NonHDL: 193.76
Total CHOL/HDL Ratio: 4
Triglycerides: 139 mg/dL (ref 0.0–149.0)
VLDL: 27.8 mg/dL (ref 0.0–40.0)

## 2019-12-29 LAB — TSH: TSH: 3.44 u[IU]/mL (ref 0.35–4.50)

## 2019-12-29 NOTE — Assessment & Plan Note (Signed)
Chronic problem, controlling w/ healthy diet and regular exercise.  Check labs and start meds prn.

## 2019-12-29 NOTE — Assessment & Plan Note (Signed)
Chronic problem, following w/ Dr Tat.

## 2019-12-29 NOTE — Patient Instructions (Signed)
Follow up in 1 year or as needed We'll notify you of your lab results and make any changes if needed Keep up the good work!  You look great! Call with any questions or concerns Stay Safe!  Stay Healthy!!! 

## 2019-12-29 NOTE — Assessment & Plan Note (Signed)
Pt has recovered fully

## 2019-12-29 NOTE — Assessment & Plan Note (Signed)
Pt's PE unchanged from previous.  UTD on pap, mammo, colonoscopy, Tdap.  Check labs.  Anticipatory guidance provided.

## 2019-12-29 NOTE — Progress Notes (Signed)
   Subjective:    Patient ID: Kristi Stark, female    DOB: Nov 27, 1955, 64 y.o.   MRN: [id number]  HPI CPE- UTD on pap, mammo, colonoscopy.  UTD on Tdap.  Declined flu   Review of Systems Patient reports no vision/ hearing changes, adenopathy,fever, weight change,  persistant/recurrent hoarseness , swallowing issues, chest pain, palpitations, edema, persistant/recurrent cough, hemoptysis, dyspnea (rest/exertional/paroxysmal nocturnal), gastrointestinal bleeding (melena, rectal bleeding), abdominal pain, significant heartburn, bowel changes, GU symptoms (dysuria, hematuria, incontinence), Gyn symptoms (abnormal  bleeding, pain),  syncope, focal weakness, memory loss, numbness & tingling, skin/hair/nail changes, abnormal bruising or bleeding, anxiety, or depression.   This visit occurred during the SARS-CoV-2 public health emergency.  Safety protocols were in place, including screening questions prior to the visit, additional usage of staff PPE, and extensive cleaning of exam room while observing appropriate contact time as indicated for disinfecting solutions.       Objective:   Physical Exam General Appearance:    Alert, cooperative, no distress, appears stated age  Head:    Normocephalic, without obvious abnormality, atraumatic  Eyes:    PERRL, conjunctiva/corneas clear, EOM's intact, fundi    benign, both eyes  Ears:    Normal TM's and external ear canals, both ears  Nose:   Nares normal, septum midline, mucosa normal, no drainage    or sinus tenderness  Throat:   Lips, mucosa, and tongue normal; teeth and gums normal  Neck:   Supple, symmetrical, trachea midline, no adenopathy;    Thyroid: no enlargement/tenderness/nodules  Back:     Symmetric, no curvature, ROM normal, no CVA tenderness  Lungs:     Clear to auscultation bilaterally, respirations unlabored  Chest Wall:    No tenderness or deformity   Heart:    Regular rate and rhythm, S1 and S2 normal, no murmur, rub   or gallop   Breast Exam:    Deferred to   Abdomen:     Soft, non-tender, bowel sounds active all four quadrants,    no masses, no organomegaly  Genitalia:    Deferred to GYN  Rectal:    Extremities:   Extremities normal, atraumatic, no cyanosis or edema  Pulses:   2+ and symmetric all extremities  Skin:   Skin color, texture, turgor normal, no rashes or lesions  Lymph nodes:   Cervical, supraclavicular, and axillary nodes normal  Neurologic:   CNII-XII intact, normal strength, sensation and reflexes    Throughout.  + tremor of hands          Assessment & Plan:

## 2020-01-09 ENCOUNTER — Ambulatory Visit: Payer: BC Managed Care – PPO | Attending: Internal Medicine

## 2020-01-09 DIAGNOSIS — Z23 Encounter for immunization: Secondary | ICD-10-CM

## 2020-01-09 NOTE — Progress Notes (Signed)
   Covid-19 Vaccination Clinic  Name:  ZI SEK    MRN: [id number] DOB: 1956-09-15  01/09/2020  Ms. Schrader was observed post Covid-19 immunization for 15 minutes without incident. She was provided with Vaccine Information Sheet and instruction to access the V-Safe system.   Ms. Dinning was instructed to call 911 with any severe reactions post vaccine: Marland Kitchen Difficulty breathing  . Swelling of face and throat  . A fast heartbeat  . A bad rash all over body  . Dizziness and weakness   Immunizations Administered    Name Date Dose VIS Date Route   Pfizer COVID-19 Vaccine 01/09/2020  2:23 PM 0.3 mL 10/07/2019 Intramuscular   Manufacturer: [employer], [employer]   Lot: [id number]   NDC: [address]-2

## 2020-01-12 DIAGNOSIS — D225 Melanocytic nevi of trunk: Secondary | ICD-10-CM | POA: Diagnosis not present

## 2020-01-12 DIAGNOSIS — D2262 Melanocytic nevi of left upper limb, including shoulder: Secondary | ICD-10-CM | POA: Diagnosis not present

## 2020-01-12 DIAGNOSIS — L720 Epidermal cyst: Secondary | ICD-10-CM | POA: Diagnosis not present

## 2020-01-12 DIAGNOSIS — D224 Melanocytic nevi of scalp and neck: Secondary | ICD-10-CM | POA: Diagnosis not present

## 2020-01-31 ENCOUNTER — Ambulatory Visit: Payer: BC Managed Care – PPO

## 2020-01-31 ENCOUNTER — Ambulatory Visit: Payer: BC Managed Care – PPO | Attending: Internal Medicine

## 2020-01-31 DIAGNOSIS — Z23 Encounter for immunization: Secondary | ICD-10-CM

## 2020-01-31 NOTE — Progress Notes (Signed)
   Covid-19 Vaccination Clinic  Name:  Kristi Stark    MRN: [id number] DOB: 06/24/1956  01/31/2020  Kristi Stark was observed post Covid-19 immunization for 15 minutes without incident. She was provided with Vaccine Information Sheet and instruction to access the V-Safe system.   Kristi Stark was instructed to call 911 with any severe reactions post vaccine: Marland Kitchen Difficulty breathing  . Swelling of face and throat  . A fast heartbeat  . A bad rash all over body  . Dizziness and weakness   Immunizations Administered    Name Date Dose VIS Date Route   Pfizer COVID-19 Vaccine 01/31/2020  4:24 PM 0.3 mL 10/07/2019 Intramuscular   Manufacturer: [employer], [employer]   Lot: [id number]   NDC: [address]-5

## 2020-03-08 ENCOUNTER — Other Ambulatory Visit: Payer: Self-pay | Admitting: Family Medicine

## 2020-03-08 DIAGNOSIS — Z1231 Encounter for screening mammogram for malignant neoplasm of breast: Secondary | ICD-10-CM

## 2020-04-09 ENCOUNTER — Other Ambulatory Visit: Payer: Self-pay

## 2020-04-09 ENCOUNTER — Ambulatory Visit
Admission: RE | Admit: 2020-04-09 | Discharge: 2020-04-09 | Disposition: A | Discharge status: Home / Self Care | Payer: BC Managed Care – PPO | Source: Ambulatory Visit | Attending: Family Medicine | Admitting: Family Medicine

## 2020-04-09 DIAGNOSIS — Z1231 Encounter for screening mammogram for malignant neoplasm of breast: Secondary | ICD-10-CM

## 2020-04-17 ENCOUNTER — Ambulatory Visit: Payer: BC Managed Care – PPO | Admitting: Neurology

## 2020-06-06 DIAGNOSIS — H524 Presbyopia: Secondary | ICD-10-CM | POA: Diagnosis not present

## 2020-06-06 DIAGNOSIS — H2513 Age-related nuclear cataract, bilateral: Secondary | ICD-10-CM | POA: Diagnosis not present

## 2020-08-09 ENCOUNTER — Ambulatory Visit (INDEPENDENT_AMBULATORY_CARE_PROVIDER_SITE_OTHER): Payer: Medicare Other

## 2020-08-09 ENCOUNTER — Other Ambulatory Visit: Payer: Self-pay

## 2020-08-09 DIAGNOSIS — Z23 Encounter for immunization: Secondary | ICD-10-CM

## 2020-09-08 ENCOUNTER — Encounter: Payer: Self-pay | Admitting: Family Medicine

## 2020-09-10 ENCOUNTER — Other Ambulatory Visit: Payer: Self-pay

## 2020-09-10 DIAGNOSIS — G20 Parkinson's disease: Secondary | ICD-10-CM

## 2020-11-05 ENCOUNTER — Ambulatory Visit: Payer: BC Managed Care – PPO | Admitting: Neurology

## 2020-12-31 ENCOUNTER — Ambulatory Visit (INDEPENDENT_AMBULATORY_CARE_PROVIDER_SITE_OTHER): Payer: Medicare Other | Admitting: Family Medicine

## 2020-12-31 ENCOUNTER — Other Ambulatory Visit: Payer: Self-pay

## 2020-12-31 ENCOUNTER — Encounter: Payer: Self-pay | Admitting: Family Medicine

## 2020-12-31 VITALS — BP 112/80 | HR 54 | Temp 97.3°F | Resp 14 | Ht 64.0 in | Wt 137.0 lb

## 2020-12-31 DIAGNOSIS — G20A1 Parkinson's disease without dyskinesia, without mention of fluctuations: Secondary | ICD-10-CM

## 2020-12-31 DIAGNOSIS — E785 Hyperlipidemia, unspecified: Secondary | ICD-10-CM | POA: Diagnosis not present

## 2020-12-31 DIAGNOSIS — Z23 Encounter for immunization: Secondary | ICD-10-CM

## 2020-12-31 DIAGNOSIS — G20 Parkinson's disease: Secondary | ICD-10-CM | POA: Diagnosis not present

## 2020-12-31 DIAGNOSIS — Z0000 Encounter for general adult medical examination without abnormal findings: Secondary | ICD-10-CM

## 2020-12-31 LAB — LIPID PANEL
Cholesterol: 270 mg/dL — ABNORMAL HIGH (ref 0–200)
HDL: 82.1 mg/dL (ref >39.00)
LDL Cholesterol: 158 mg/dL — ABNORMAL HIGH (ref 0–99)
NonHDL: 187.93
Total CHOL/HDL Ratio: 3
Triglycerides: 152 mg/dL — ABNORMAL HIGH (ref 0.0–149.0)
VLDL: 30.4 mg/dL (ref 0.0–40.0)

## 2020-12-31 LAB — CBC WITH DIFFERENTIAL/PLATELET
Basophils Absolute: 0.1 10*3/uL (ref 0.0–0.1)
Basophils Relative: 1 % (ref 0.0–3.0)
Eosinophils Absolute: 0.3 10*3/uL (ref 0.0–0.7)
Eosinophils Relative: 4.4 % (ref 0.0–5.0)
HCT: 41.8 % (ref 36.0–46.0)
Hemoglobin: 14 g/dL (ref 12.0–15.0)
Lymphocytes Relative: 48.6 % — ABNORMAL HIGH (ref 12.0–46.0)
Lymphs Abs: 2.9 10*3/uL (ref 0.7–4.0)
MCHC: 33.5 g/dL (ref 30.0–36.0)
MCV: 94.1 fl (ref 78.0–100.0)
Monocytes Absolute: 0.4 10*3/uL (ref 0.1–1.0)
Monocytes Relative: 6.6 % (ref 3.0–12.0)
Neutro Abs: 2.4 10*3/uL (ref 1.4–7.7)
Neutrophils Relative %: 39.4 % — ABNORMAL LOW (ref 43.0–77.0)
Platelets: 199 10*3/uL (ref 150.0–400.0)
RBC: 4.44 Mil/uL (ref 3.87–5.11)
RDW: 12.3 % (ref 11.5–15.5)
WBC: 6 10*3/uL (ref 4.0–10.5)

## 2020-12-31 LAB — HEPATIC FUNCTION PANEL
ALT: 17 U/L (ref 0–35)
AST: 23 U/L (ref 0–37)
Albumin: 4.5 g/dL (ref 3.5–5.2)
Alkaline Phosphatase: 60 U/L (ref 39–117)
Bilirubin, Direct: 0.1 mg/dL (ref 0.0–0.3)
Total Bilirubin: 0.6 mg/dL (ref 0.2–1.2)
Total Protein: 7.4 g/dL (ref 6.0–8.3)

## 2020-12-31 LAB — BASIC METABOLIC PANEL
BUN: 17 mg/dL (ref 6–23)
CO2: 29 mEq/L (ref 19–32)
Calcium: 9.8 mg/dL (ref 8.4–10.5)
Chloride: 106 mEq/L (ref 96–112)
Creatinine, Ser: 0.89 mg/dL (ref 0.40–1.20)
GFR: 68 mL/min (ref >60.00)
Glucose, Bld: 92 mg/dL (ref 70–99)
Potassium: 3.6 mEq/L (ref 3.5–5.1)
Sodium: 141 mEq/L (ref 135–145)

## 2020-12-31 LAB — TSH: TSH: 4.24 u[IU]/mL (ref 0.35–4.50)

## 2020-12-31 NOTE — Patient Instructions (Signed)
Follow up in 1 yr or as needed We'll notify you of your lab results and make any changes if needed Keep up the good work on healthy diet and regular exercise- you look great! Call with any questions or concerns Stay Safe!  Stay Healthy!

## 2020-12-31 NOTE — Assessment & Plan Note (Signed)
Pt's PE WNL w/ exception of L hand tremor.  UTD on colonoscopy, mammo, Tdap, flu, COVID.  Prevnar given.  Check labs.  Discussed health maintenance schedule w/ pt and applauded her healthy lifestyle.  Will follow.

## 2020-12-31 NOTE — Assessment & Plan Note (Signed)
Following w/ Dr Antonietta Barcelona.  Not currently on medication.  Will follow along.

## 2020-12-31 NOTE — Assessment & Plan Note (Signed)
Chronic problem.  On Red Yeast Rice daily.  She exercises regularly.  Check labs.  Adjust meds prn

## 2020-12-31 NOTE — Progress Notes (Addendum)
Subjective:    Patient ID: Kristi Stark, female    DOB: 1956-05-28, 65 y.o.   MRN: [id number]  HPI Here today for CPE.  Risk Factors: Hyperlipidemia- chronic problem, on Red Yeast Rice daily Parkinson's Disease- Following w/ [facility] Physical Activity: exercising regularly Fall Risk: low but at risk due to Parkinson's Depression: denies current sxs Hearing: normal to conversational tones and whispered voice ADL's: independent Cognitive: normal linear thought process, memory and attention intact Home Safety: safe at home, lives w/ husband Height, Weight, BMI, Visual Acuity: see vitals, vision corrected to 20/20 w/ glasses Counseling: UTD on colonoscopy, mammo, flu, Tdap, COVID.  Due for Prevnar Healthcare POA/Living will: pt has 1 in place Labs Ordered: See A&P Care Plan: See A&P   Patient Care Team    Relationship Specialty Notifications Start End  Sheliah Hatch, MD PCP - General Family Medicine  07/01/18   Tat, Octaviano Batty, DO Consulting Physician Neurology  12/29/19   Venancio Poisson, MD Consulting Physician Dermatology  12/29/19     Health Maintenance  Topic Date Due  . PNA vac Low Risk Adult (1 of 2 - PCV13) Never done  . PAP SMEAR-Modifier  07/27/2020  . MAMMOGRAM  04/09/2022  . COLONOSCOPY (Pts 45-77yrs Insurance coverage will need to be confirmed)  12/26/2026  . TETANUS/TDAP  12/27/2028  . INFLUENZA VACCINE  Completed  . DEXA SCAN  Completed  . COVID-19 Vaccine  Completed  . Hepatitis C Screening  Completed  . HIV Screening  Completed  . HPV VACCINES  Aged Out      Review of Systems Patient reports no vision/ hearing changes, adenopathy,fever, weight change,  persistant/recurrent hoarseness , swallowing issues, chest pain, palpitations, edema, persistant/recurrent cough, hemoptysis, dyspnea (rest/exertional/paroxysmal nocturnal), gastrointestinal bleeding (melena, rectal bleeding), abdominal pain, significant heartburn, bowel changes, GU symptoms (dysuria,  hematuria, incontinence), Gyn symptoms (abnormal  bleeding, pain),  syncope, focal weakness, memory loss, numbness & tingling, skin/hair/nail changes, abnormal bruising or bleeding, anxiety, or depression.   This visit occurred during the SARS-CoV-2 public health emergency.  Safety protocols were in place, including screening questions prior to the visit, additional usage of staff PPE, and extensive cleaning of exam room while observing appropriate contact time as indicated for disinfecting solutions.       Objective:   Physical Exam General Appearance:    Alert, cooperative, no distress, appears stated age  Head:    Normocephalic, without obvious abnormality, atraumatic  Eyes:    PERRL, conjunctiva/corneas clear, EOM's intact, fundi    benign, both eyes  Ears:    Normal TM's and external ear canals, both ears  Nose:   Deferred due to COVID  Throat:   Neck:   Supple, symmetrical, trachea midline, no adenopathy;    Thyroid: no enlargement/tenderness/nodules  Back:     Symmetric, no curvature, ROM normal, no CVA tenderness  Lungs:     Clear to auscultation bilaterally, respirations unlabored  Chest Wall:    No tenderness or deformity   Heart:    Regular rate and rhythm, S1 and S2 normal, no murmur, rub   or gallop  Breast Exam:    Deferred to mammo  Abdomen:     Soft, non-tender, bowel sounds active all four quadrants,    no masses, no organomegaly  Genitalia:    Deferred  Rectal:    Extremities:   Extremities normal, atraumatic, no cyanosis or edema  Pulses:   2+ and symmetric all extremities  Skin:   Skin color, texture,  turgor normal, no rashes or lesions  Lymph nodes:   Cervical, supraclavicular, and axillary nodes normal  Neurologic:   CNII-XII intact, normal strength, sensation and reflexes    throughout.  + L hand tremor          Assessment & Plan:

## 2021-02-25 ENCOUNTER — Other Ambulatory Visit: Payer: Self-pay | Admitting: Family Medicine

## 2021-02-25 DIAGNOSIS — Z1231 Encounter for screening mammogram for malignant neoplasm of breast: Secondary | ICD-10-CM

## 2021-03-11 IMAGING — MG DIGITAL DIAGNOSTIC UNILATERAL RIGHT MAMMOGRAM WITH TOMO AND CAD
4 series · 4 of 12 positions shown · non-contrast
Comparison: Previous exam(s).

CLINICAL DATA: 62-year-old female recalled from screening mammogram
dated 03/10/2019 for a possible right breast asymmetry.

EXAM:
DIGITAL DIAGNOSTIC UNILATERAL RIGHT MAMMOGRAM WITH CAD AND TOMO

[R MLO synth-2D]
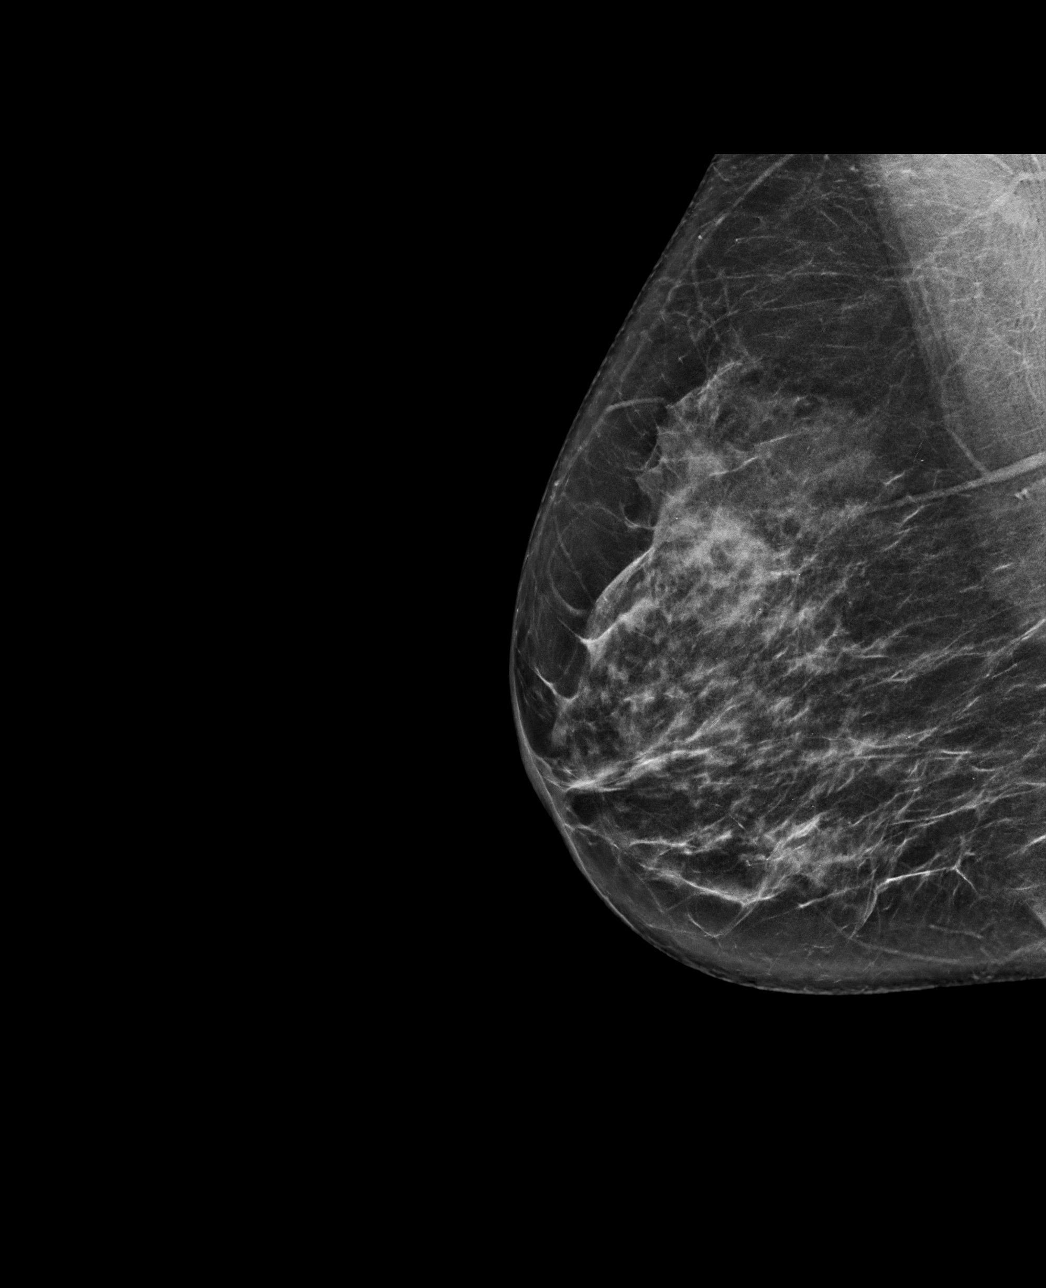

[R CC synth-2D]
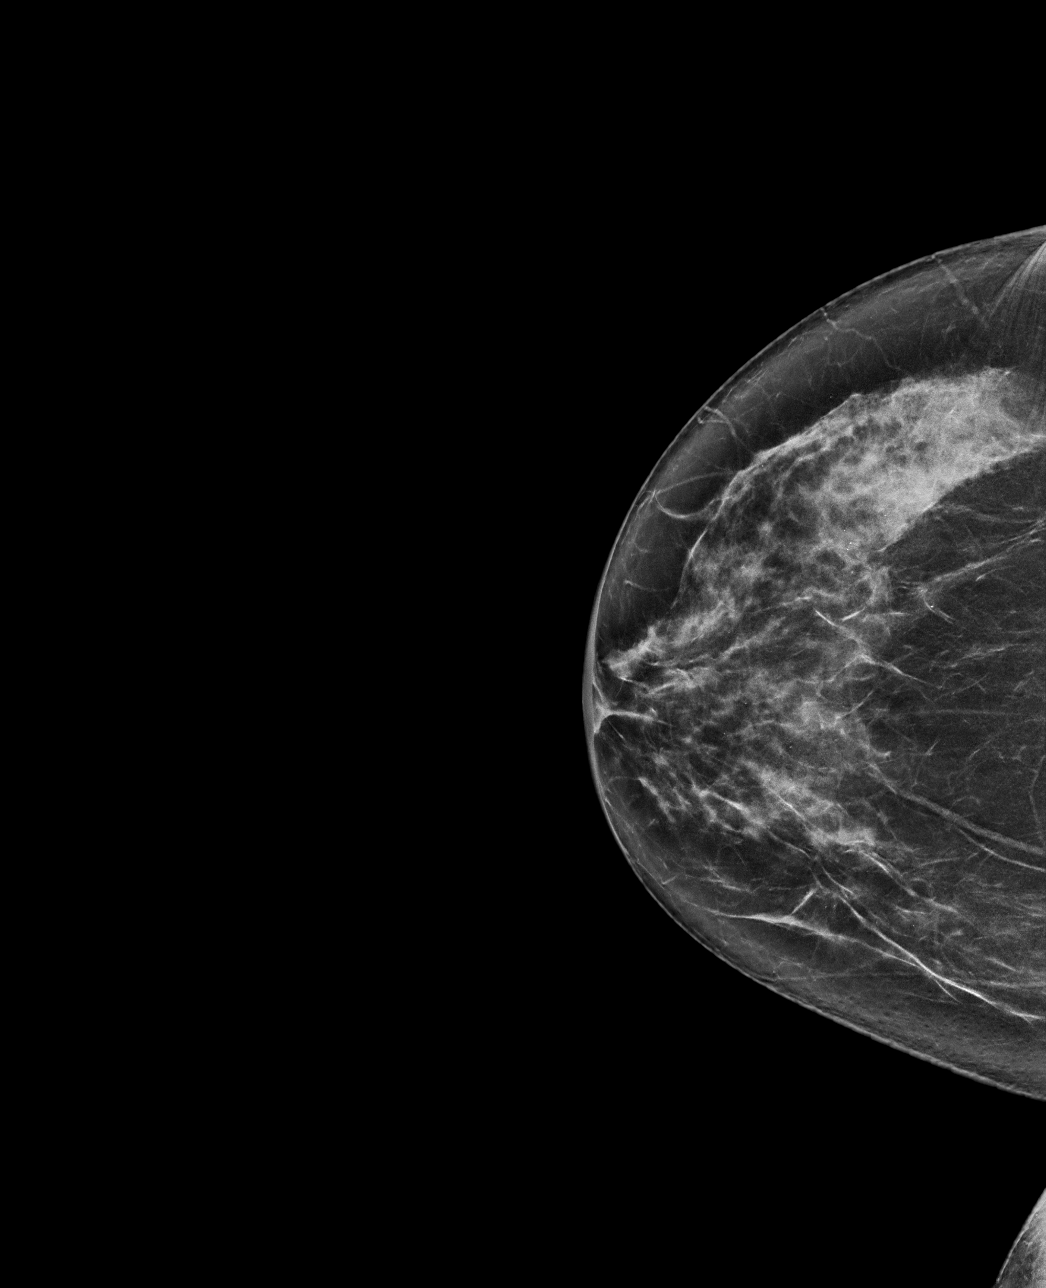

[R CC tomo · tomo slice 37/73.0]
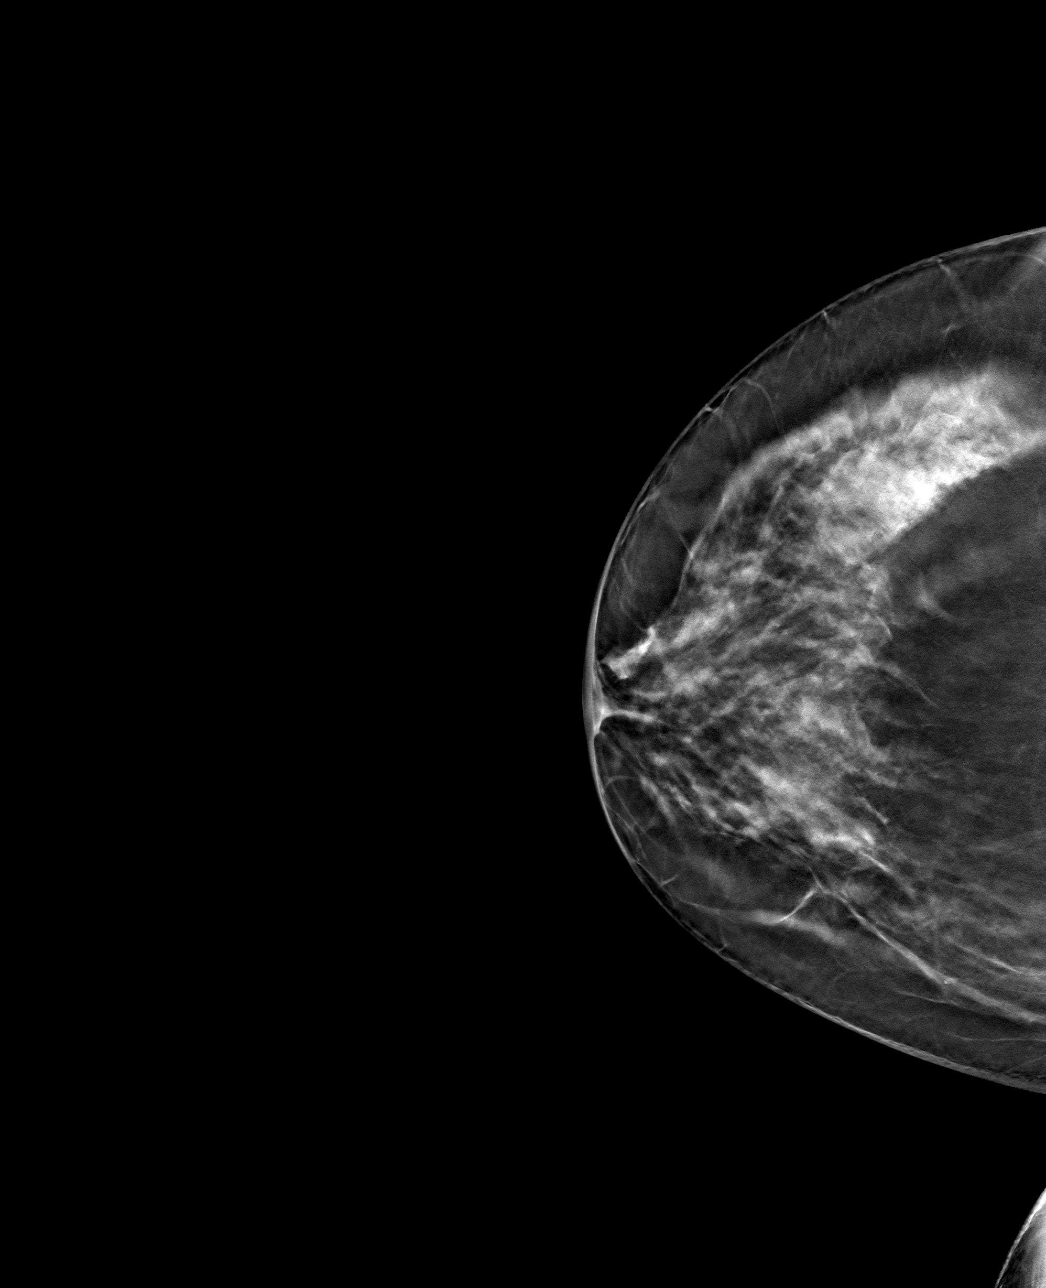

[R MLO tomo · tomo slice 37/72.0]
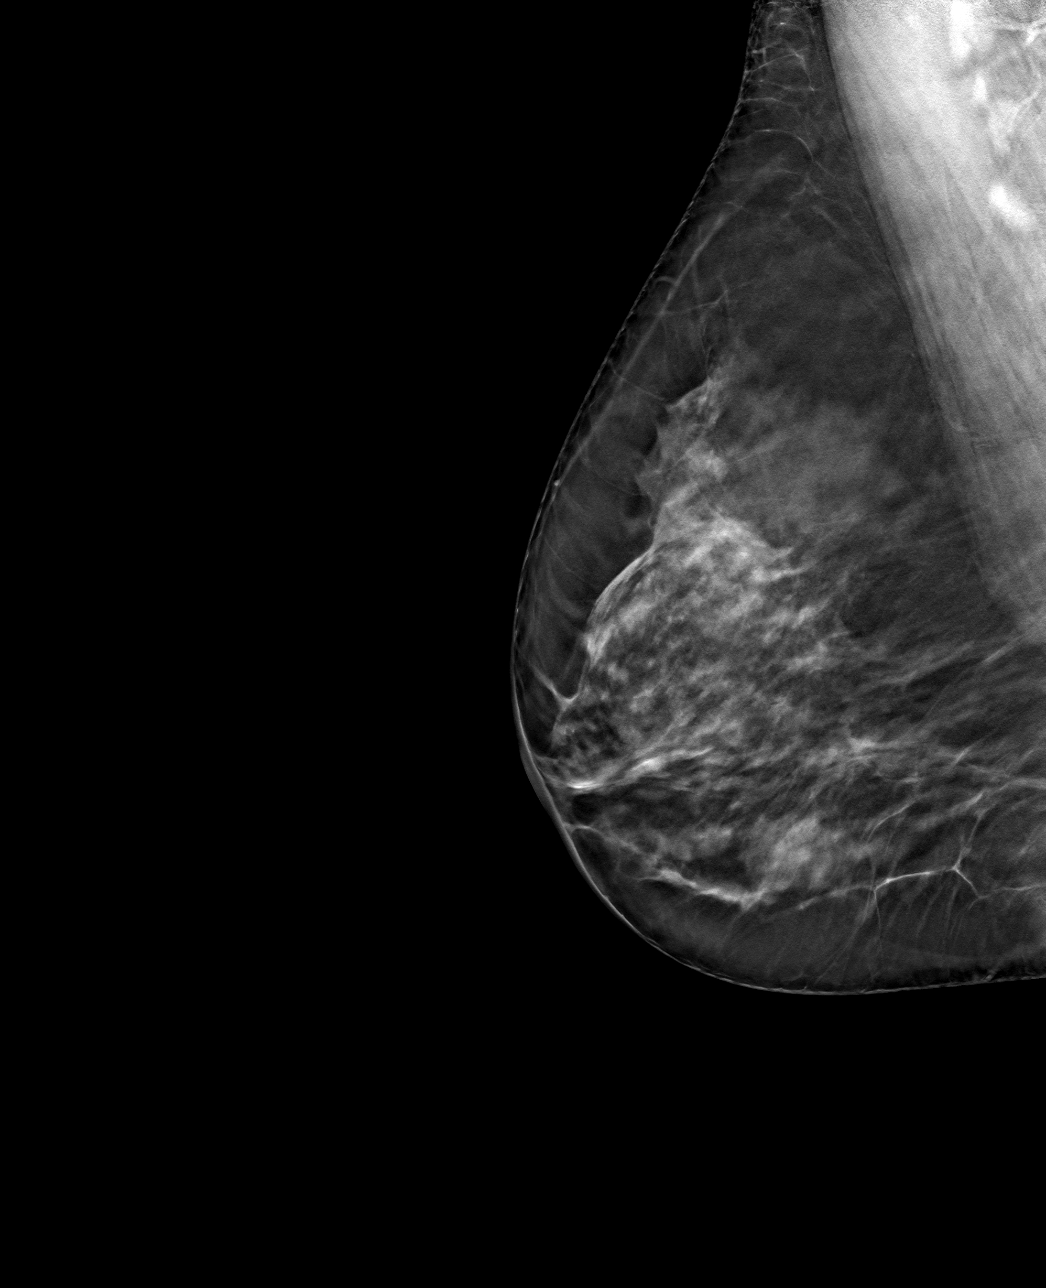

[4 of 12 positions shown; findings below may reference images not displayed]

ACR Breast Density Category c: The breast tissue is heterogeneously
dense, which may obscure small masses.
FINDINGS: Previously described, possible asymmetry in the inferior right
breast on the MLO projection resolves into well dispersed
fibroglandular tissue on today's additional views. No suspicious
findings are identified.

Mammographic images were processed with CAD.
IMPRESSION: No mammographic evidence of malignancy.

RECOMMENDATION:
Screening mammogram in one year.(Code:[id number])

I have discussed the findings and recommendations with the patient.
Results were also provided in writing at the conclusion of the
visit. If applicable, a reminder letter will be sent to the patient
regarding the next appointment.

BI-RADS CATEGORY  1: Negative.

## 2021-04-24 ENCOUNTER — Encounter: Payer: Self-pay | Admitting: *Deleted

## 2021-04-30 ENCOUNTER — Telehealth: Payer: Self-pay

## 2021-04-30 NOTE — Telephone Encounter (Signed)
Pt had last booster in Nov 21, and two vaccine and pt is wanting to know if it is necessary  to get this third booster she is asking for her and her husband but I will send a separate message on him as well. Pt will be traveling to [place] for her daughters wedding the 8 th of July. Pt is 64   Pt call back [phone]

## 2021-05-01 NOTE — Telephone Encounter (Signed)
Given the high prevalence of COVID in the community, I recommend that any person >65 or with any underlying health conditions receive their 2nd booster- particularly before traveling.  She can get that at her local pharmacy at any time

## 2021-05-01 NOTE — Telephone Encounter (Signed)
Left a voicemail message with provider recommendations. 

## 2021-05-30 ENCOUNTER — Other Ambulatory Visit: Payer: Self-pay

## 2021-05-30 ENCOUNTER — Ambulatory Visit
Admission: RE | Admit: 2021-05-30 | Discharge: 2021-05-30 | Disposition: A | Discharge status: Home / Self Care | Payer: Medicare Other | Source: Ambulatory Visit | Attending: Family Medicine | Admitting: Family Medicine

## 2021-05-30 DIAGNOSIS — Z1231 Encounter for screening mammogram for malignant neoplasm of breast: Secondary | ICD-10-CM

## 2021-07-09 ENCOUNTER — Encounter: Payer: Self-pay | Admitting: Family Medicine

## 2021-07-09 ENCOUNTER — Other Ambulatory Visit: Payer: Self-pay

## 2021-07-09 DIAGNOSIS — G43809 Other migraine, not intractable, without status migrainosus: Secondary | ICD-10-CM

## 2021-07-09 MED ORDER — RIZATRIPTAN BENZOATE 5 MG PO TBDP: ORAL_TABLET | ORAL | 1 refills | Status: AC

## 2021-08-15 ENCOUNTER — Encounter: Payer: Self-pay | Admitting: Family Medicine

## 2021-08-15 ENCOUNTER — Ambulatory Visit (INDEPENDENT_AMBULATORY_CARE_PROVIDER_SITE_OTHER): Payer: Medicare Other

## 2021-08-15 ENCOUNTER — Other Ambulatory Visit: Payer: Self-pay

## 2021-08-15 DIAGNOSIS — Z23 Encounter for immunization: Secondary | ICD-10-CM | POA: Diagnosis not present

## 2021-08-15 NOTE — Progress Notes (Signed)
Pt presented today for high dose flu shot and noted no previous reactions and no recent fever. Administered to Rt deltoid no complications

## 2021-12-02 ENCOUNTER — Ambulatory Visit (INDEPENDENT_AMBULATORY_CARE_PROVIDER_SITE_OTHER): Payer: Medicare Other | Admitting: Family Medicine

## 2021-12-02 ENCOUNTER — Other Ambulatory Visit: Payer: Self-pay

## 2021-12-02 ENCOUNTER — Encounter: Payer: Self-pay | Admitting: Family Medicine

## 2021-12-02 ENCOUNTER — Other Ambulatory Visit: Payer: Medicare Other

## 2021-12-02 ENCOUNTER — Ambulatory Visit (INDEPENDENT_AMBULATORY_CARE_PROVIDER_SITE_OTHER)
Admission: RE | Admit: 2021-12-02 | Discharge: 2021-12-02 | Disposition: A | Discharge status: Home / Self Care | Payer: Medicare Other | Source: Ambulatory Visit | Attending: Family Medicine | Admitting: Family Medicine

## 2021-12-02 VITALS — BP 118/62 | HR 64 | Temp 98.2°F | Resp 17 | Ht 64.0 in | Wt 141.6 lb

## 2021-12-02 DIAGNOSIS — R0609 Other forms of dyspnea: Secondary | ICD-10-CM | POA: Diagnosis not present

## 2021-12-02 DIAGNOSIS — R051 Acute cough: Secondary | ICD-10-CM

## 2021-12-02 LAB — POC COVID19 BINAXNOW: SARS Coronavirus 2 Ag: NEGATIVE

## 2021-12-02 LAB — POC INFLUENZA A&B (BINAX/QUICKVUE)
Influenza A, POC: NEGATIVE
Influenza B, POC: NEGATIVE

## 2021-12-02 MED ORDER — BENZONATATE 100 MG PO CAPS: 100.0000 mg | ORAL_CAPSULE | Freq: Three times a day (TID) | ORAL | 0 refills | Status: DC | PRN

## 2021-12-02 NOTE — Progress Notes (Signed)
Subjective:  Patient ID: Kristi Stark, female    DOB: September 06, 1956  Age: 66 y.o. MRN: [id number]  CC:  Chief Complaint  Patient presents with   Cough    Pt reports cough, sore throat starting Friday am, sinus congestion, notes burning in face, DOE when walking across the house, notes productive cough, negative for COVID yesterday     HPI [name] presents for   Cough: Kept 2yo grandaughter last week - sick, diagnosed with ear infection. No known exposure to flu/covid.  Started 3 days ago.  Some sore throat initially sinus congestion, nasal passages burning.  Some shortness of breath with walking around the house. None at rest. Fatigue. Drinking fluids ok. No confusion/chest pain. Sore to cough only. Some productive cough.  Home testing for COVID-negative yesterday am.  Felt febrile 2 days ago, unable to measure..   Tx: tylenol, advil. Otc decongestant - too  drying.    COVID-19 vaccination  March and April 2021, booster November 2021.  COVID risk of complication score of 1. no bivalent booster, booster last summer?  Did receive flu vaccine in last season.   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 Authorizing Provider  COD LIVER OIL PO Take by mouth.   Yes [provider]  Multiple Vitamins-Minerals (ZINC PO) Take by mouth.   Yes [provider]  Red Yeast Rice  Extract 600 MG TABS Take 1 tablet by mouth daily.   Yes [provider]  rizatriptan (MAXALT-MLT) 5 MG disintegrating tablet PRN 07/09/21  Yes Sheliah Hatch, MD  Vitamin D-Vitamin K (K2 PLUS D3 PO) Take by mouth.   Yes [provider]  Zinc Acetate 50 MG CAPS Take 1 capsule by mouth daily.   Yes [provider]   Social History   Socioeconomic History   Marital status: Married    Spouse name: Not on file   Number of children: Not on file   Years of education: Not on file   Highest education level: Not on file  Occupational History   Not on file  Tobacco Use   Smoking status: Never   Smokeless tobacco: Never  Vaping Use   Vaping Use: Never used  Substance and Sexual Activity   Alcohol use: Yes    Comment: wine every 2 weeks   Drug use: Never   Sexual activity: Yes  Other Topics Concern   Not on file  Social History Narrative   Not on file   Social Determinants of Health   Financial Resource Strain: Not on file  Food Insecurity: Not on file  Transportation Needs: Not on file  Physical Activity: Not on file  Stress: Not on file  Social Connections: Not on file  Intimate Partner Violence: Not on file    Review of Systems Per HPI.   Objective:   Vitals:  12/02/21 1104  BP: 118/62  Pulse: 64  Resp: 17  Temp: 98.2 F (36.8 C)  TempSrc: Temporal  SpO2: 96%  Weight: 141 lb 9.6 oz (64.2 kg)  Height: 5\' 4"  (1.626 m)     Physical Exam Vitals reviewed.  Constitutional:      General: She is not in acute distress.    Appearance: She is well-developed.  HENT:     Head: Normocephalic and atraumatic.     Right Ear: Hearing, tympanic membrane, ear canal and external ear normal.     Left Ear: Hearing, tympanic membrane, ear canal and external ear normal.     Nose: Nose normal.     Mouth/Throat:     Pharynx: No posterior oropharyngeal erythema.  Eyes:     Conjunctiva/sclera: Conjunctivae normal.     Pupils: Pupils are equal, round,  and reactive to light.  Cardiovascular:     Rate and Rhythm: Normal rate and regular rhythm.     Heart sounds: Normal heart sounds. No murmur heard. Pulmonary:     Effort: Pulmonary effort is normal. No respiratory distress.     Breath sounds: Normal breath sounds. No wheezing or rhonchi.  Skin:    General: Skin is warm and dry.     Findings: No rash.  Neurological:     Mental Status: She is alert and oriented to person, place, and time.  Psychiatric:        Mood and Affect: Mood normal.        Behavior: Behavior normal.     DG Chest 2 View  Result Date: 12/02/2021 CLINICAL DATA:  Cough, dyspnea on exertion EXAM: CHEST - 2 VIEW COMPARISON:  None. FINDINGS: The heart size and mediastinal contours are within normal limits. Both lungs are clear. The visualized skeletal structures are unremarkable. IMPRESSION: No active cardiopulmonary disease. Electronically Signed   By: Duanne Guess D.O.   On: 12/02/2021 13:12     Assessment & Plan:  [name] is a 66 y.o. female . Acute cough - Plan: POC Influenza A&B(BINAX/QUICKVUE), POC COVID-19 BinaxNow, DG Chest 2 View, benzonatate (TESSALON) 100 MG capsule  DOE (dyspnea on exertion) - Plan: DG Chest 2 View  Suspected viral upper respiratory infection with sick contact.  Negative COVID, flu testing in office.  Chest x-ray ordered with dyspnea on exertion symptoms as above, but reassuring.  Symptomatic care with Tessalon, fluids, rest and RTC/ER precautions  Meds ordered this encounter  Medications   benzonatate (TESSALON) 100 MG capsule    Sig: Take 1 capsule (100 mg total) by mouth 3 (three) times daily as needed for cough.    Dispense:  20 capsule    Refill:  0   Patient Instructions  COVID and flu test were negative/normal today.  Please have x-ray performed at the [facility] location.  If concerns on x-ray I will let you know.  Tessalon Perles for cough, fluids, rest.  Recheck next few days if not improving.  You can check  an additional COVID test at home tomorrow but with negative test today that is unlikely the cause of your symptoms.  If you do have a positive test let me know.  Cough, Adult Coughing is a reflex that clears your throat and your airways (respiratory system). Coughing helps to heal and protect your lungs. It is normal to cough occasionally, but a cough that happens with other symptoms or lasts a long time may be a sign of a condition that needs treatment. An acute  cough may only last 2-3 weeks, while a chronic cough may last 8 or more weeks. Coughing is commonly caused by: Infection of the respiratory systemby viruses or bacteria. Breathing in substances that irritate your lungs. Allergies. Asthma. Mucus that runs down the back of your throat (postnasal drip). Smoking. Acid backing up from the stomach into the esophagus (gastroesophageal reflux). Certain medicines. Chronic lung problems. Other medical conditions such as heart failure or a blood clot in the lung (pulmonary embolism). Follow these instructions at home: Medicines Take over-the-counter and prescription medicines only as told by your health care provider. Talk with your health care provider before you take a cough suppressant medicine. Lifestyle  Avoid cigarette smoke. Do not use any products that contain nicotine or tobacco, such as cigarettes, e-cigarettes, and chewing tobacco. If you need help quitting, ask your health care provider. Drink enough fluid to keep your urine pale yellow. Avoid caffeine. Do not drink alcohol if your health care provider tells you not to drink. General instructions  Pay close attention to changes in your cough. Tell your health care provider about them. Always cover your mouth when you cough. Avoid things that make you cough, such as perfume, candles, cleaning products, or campfire or tobacco smoke. If the air is dry, use a cool mist vaporizer or humidifier in your bedroom or your home to help  loosen secretions. If your cough is worse at night, try to sleep in a semi-upright position. Rest as needed. Keep all follow-up visits as told by your health care provider. This is important. Contact a health care provider if you: Have new symptoms. Cough up pus. Have a cough that does not get better after 2-3 weeks or gets worse. Cannot control your cough with cough suppressant medicines and you are losing sleep. Have pain that gets worse or pain that is not helped with medicine. Have a fever. Have unexplained weight loss. Have night sweats. Get help right away if: You cough up blood. You have difficulty breathing. Your heartbeat is very fast. These symptoms may represent a serious problem that is an emergency. Do not wait to see if the symptoms will go away. Get medical help right away. Call your local emergency services (911 in the [place] Do not drive yourself to the hospital. Summary Coughing is a reflex that clears your throat and your airways. It is normal to cough occasionally, but a cough that happens with other symptoms or lasts a long time may be a sign of a condition that needs treatment. Take over-the-counter and prescription medicines only as told by your health care provider. Always cover your mouth when you cough. Contact a health care provider if you have new symptoms or a cough that does not get better after 2-3 weeks or gets worse. This information is not intended to replace advice given to you by your health care provider. Make sure you discuss any questions you have with your health care provider. Document Revised: 11/01/2018 Document Reviewed: 11/01/2018 Elsevier Patient Education  2022 Elsevier Inc.      Signed,   Meredith Staggers, MD [facility], [facility] 12/02/21 9:33 PM

## 2021-12-02 NOTE — Patient Instructions (Signed)
COVID and flu test were negative/normal today.  Please have x-ray performed at the [facility] location.  If concerns on x-ray I will let you know.  Tessalon Perles for cough, fluids, rest.  Recheck next few days if not improving.  You can check an additional COVID test at home tomorrow but with negative test today that is unlikely the cause of your symptoms.  If you do have a positive test let me know.  Cough, Adult Coughing is a reflex that clears your throat and your airways (respiratory system). Coughing helps to heal and protect your lungs. It is normal to cough occasionally, but a cough that happens with other symptoms or lasts a long time may be a sign of a condition that needs treatment. An acute cough may only last 2-3 weeks, while a chronic cough may last 8 or more weeks. Coughing is commonly caused by: Infection of the respiratory systemby viruses or bacteria. Breathing in substances that irritate your lungs. Allergies. Asthma. Mucus that runs down the back of your throat (postnasal drip). Smoking. Acid backing up from the stomach into the esophagus (gastroesophageal reflux). Certain medicines. Chronic lung problems. Other medical conditions such as heart failure or a blood clot in the lung (pulmonary embolism). Follow these instructions at home: Medicines Take over-the-counter and prescription medicines only as told by your health care provider. Talk with your health care provider before you take a cough suppressant medicine. Lifestyle  Avoid cigarette smoke. Do not use any products that contain nicotine or tobacco, such as cigarettes, e-cigarettes, and chewing tobacco. If you need help quitting, ask your health care provider. Drink enough fluid to keep your urine pale yellow. Avoid caffeine. Do not drink alcohol if your health care provider tells you not to drink. General instructions  Pay close attention to changes in your cough. Tell your health care provider about  them. Always cover your mouth when you cough. Avoid things that make you cough, such as perfume, candles, cleaning products, or campfire or tobacco smoke. If the air is dry, use a cool mist vaporizer or humidifier in your bedroom or your home to help loosen secretions. If your cough is worse at night, try to sleep in a semi-upright position. Rest as needed. Keep all follow-up visits as told by your health care provider. This is important. Contact a health care provider if you: Have new symptoms. Cough up pus. Have a cough that does not get better after 2-3 weeks or gets worse. Cannot control your cough with cough suppressant medicines and you are losing sleep. Have pain that gets worse or pain that is not helped with medicine. Have a fever. Have unexplained weight loss. Have night sweats. Get help right away if: You cough up blood. You have difficulty breathing. Your heartbeat is very fast. These symptoms may represent a serious problem that is an emergency. Do not wait to see if the symptoms will go away. Get medical help right away. Call your local emergency services (911 in the [place] Do not drive yourself to the hospital. Summary Coughing is a reflex that clears your throat and your airways. It is normal to cough occasionally, but a cough that happens with other symptoms or lasts a long time may be a sign of a condition that needs treatment. Take over-the-counter and prescription medicines only as told by your health care provider. Always cover your mouth when you cough. Contact a health care provider if you have new symptoms or a cough that does not  get better after 2-3 weeks or gets worse. This information is not intended to replace advice given to you by your health care provider. Make sure you discuss any questions you have with your health care provider. Document Revised: 11/01/2018 Document Reviewed: 11/01/2018 Elsevier Patient Education  2022 [employer].

## 2022-01-01 ENCOUNTER — Ambulatory Visit (INDEPENDENT_AMBULATORY_CARE_PROVIDER_SITE_OTHER): Payer: Medicare Other | Admitting: Family Medicine

## 2022-01-01 ENCOUNTER — Encounter: Payer: Self-pay | Admitting: Family Medicine

## 2022-01-01 VITALS — BP 112/74 | HR 66 | Temp 97.3°F | Resp 16 | Ht 64.5 in | Wt 143.6 lb

## 2022-01-01 DIAGNOSIS — E785 Hyperlipidemia, unspecified: Secondary | ICD-10-CM

## 2022-01-01 DIAGNOSIS — G20 Parkinson's disease: Secondary | ICD-10-CM

## 2022-01-01 LAB — LIPID PANEL
Cholesterol: 255 mg/dL — ABNORMAL HIGH (ref 0–200)
HDL: 72.3 mg/dL (ref >39.00)
LDL Cholesterol: 154 mg/dL — ABNORMAL HIGH (ref 0–99)
NonHDL: 183.05
Total CHOL/HDL Ratio: 4
Triglycerides: 144 mg/dL (ref 0.0–149.0)
VLDL: 28.8 mg/dL (ref 0.0–40.0)

## 2022-01-01 LAB — CBC WITH DIFFERENTIAL/PLATELET
Basophils Absolute: 0.1 10*3/uL (ref 0.0–0.1)
Basophils Relative: 1 % (ref 0.0–3.0)
Eosinophils Absolute: 0.2 10*3/uL (ref 0.0–0.7)
Eosinophils Relative: 4.4 % (ref 0.0–5.0)
HCT: 39.6 % (ref 36.0–46.0)
Hemoglobin: 13.5 g/dL (ref 12.0–15.0)
Lymphocytes Relative: 51.1 % — ABNORMAL HIGH (ref 12.0–46.0)
Lymphs Abs: 2.6 10*3/uL (ref 0.7–4.0)
MCHC: 34.1 g/dL (ref 30.0–36.0)
MCV: 93.3 fl (ref 78.0–100.0)
Monocytes Absolute: 0.4 10*3/uL (ref 0.1–1.0)
Monocytes Relative: 8 % (ref 3.0–12.0)
Neutro Abs: 1.8 10*3/uL (ref 1.4–7.7)
Neutrophils Relative %: 35.5 % — ABNORMAL LOW (ref 43.0–77.0)
Platelets: 208 10*3/uL (ref 150.0–400.0)
RBC: 4.24 Mil/uL (ref 3.87–5.11)
RDW: 12.5 % (ref 11.5–15.5)
WBC: 5.1 10*3/uL (ref 4.0–10.5)

## 2022-01-01 LAB — HEPATIC FUNCTION PANEL
ALT: 13 U/L (ref 0–35)
AST: 25 U/L (ref 0–37)
Albumin: 4.6 g/dL (ref 3.5–5.2)
Alkaline Phosphatase: 77 U/L (ref 39–117)
Bilirubin, Direct: 0.1 mg/dL (ref 0.0–0.3)
Total Bilirubin: 0.7 mg/dL (ref 0.2–1.2)
Total Protein: 7.1 g/dL (ref 6.0–8.3)

## 2022-01-01 LAB — BASIC METABOLIC PANEL
BUN: 15 mg/dL (ref 6–23)
CO2: 28 mEq/L (ref 19–32)
Calcium: 9.9 mg/dL (ref 8.4–10.5)
Chloride: 103 mEq/L (ref 96–112)
Creatinine, Ser: 0.79 mg/dL (ref 0.40–1.20)
GFR: 77.91 mL/min (ref >60.00)
Glucose, Bld: 92 mg/dL (ref 70–99)
Potassium: 4.1 mEq/L (ref 3.5–5.1)
Sodium: 139 mEq/L (ref 135–145)

## 2022-01-01 LAB — TSH: TSH: 3.29 u[IU]/mL (ref 0.35–5.50)

## 2022-01-01 NOTE — Assessment & Plan Note (Signed)
Deteriorated.  Increased frequency and amplitude of resting tremor.  Pt is asking for referral to [facility] as it is a Therapist, nutritional.  Referral placed to Dr Seward Meth as requested. ?

## 2022-01-01 NOTE — Assessment & Plan Note (Signed)
Chronic problem.  Attempting to control w/ diet and exercise as well as OTC Red Yeast Rice.  Check labs and determine if statin is needed. ?

## 2022-01-01 NOTE — Progress Notes (Signed)
? ?  Subjective:  ? ? Patient ID: Kristi Stark, female    DOB: 27-May-1956, 66 y.o.   MRN: [id number] ? ?HPI ?Hyperlipidemia- Last LDL 158.  Currently on Red Yeast Rice.  Exercising regularly- 6 days/week.  Denies CP, SOB, abd pain, N/V. ? ?Parkinson's Disease- chronic problem.  Currently following w/ Dr Everette Rank.  Pt did online research and found a Pension scheme manager at [facility] and one doctor in particular- Dr Lezlie Octave.  Not currently on medication but feels the time is approaching.   ? ?Health Maintenance- due for Prevnar (pt wants to hold at this time) ? ?Review of Systems ?For ROS see HPI  ? ?This visit occurred during the SARS-CoV-2 public health emergency.  Safety protocols were in place, including screening questions prior to the visit, additional usage of staff PPE, and extensive cleaning of exam room while observing appropriate contact time as indicated for disinfecting solutions.   ?   ?Objective:  ? Physical Exam ?Vitals reviewed.  ?Constitutional:   ?   General: She is not in acute distress. ?   Appearance: Normal appearance. She is well-developed. She is not ill-appearing.  ?HENT:  ?   Head: Normocephalic and atraumatic.  ?Eyes:  ?   Conjunctiva/sclera: Conjunctivae normal.  ?   Pupils: Pupils are equal, round, and reactive to light.  ?Neck:  ?   Thyroid: No thyromegaly.  ?Cardiovascular:  ?   Rate and Rhythm: Normal rate and regular rhythm.  ?   Heart sounds: Normal heart sounds. No murmur heard. ?Pulmonary:  ?   Effort: Pulmonary effort is normal. No respiratory distress.  ?   Breath sounds: Normal breath sounds.  ?Abdominal:  ?   General: There is no distension.  ?   Palpations: Abdomen is soft.  ?   Tenderness: There is no abdominal tenderness.  ?Musculoskeletal:  ?   Cervical back: Normal range of motion and neck supple.  ?Lymphadenopathy:  ?   Cervical: No cervical adenopathy.  ?Skin: ?   General: Skin is warm and dry.  ?Neurological:  ?   General: No focal deficit present.  ?   Mental  Status: She is alert and oriented to person, place, and time.  ?   Cranial Nerves: No cranial nerve deficit.  ?   Comments: Bilateral resting tremor- L>R  ?Psychiatric:     ?   Mood and Affect: Mood normal.     ?   Behavior: Behavior normal.  ? ? ? ? ? ?   ?Assessment & Plan:  ? ? ?

## 2022-01-01 NOTE — Patient Instructions (Signed)
Follow up in 1 year or as needed ?We'll notify you of your lab results and make any changes if needed ?Keep up the good work on healthy diet and regular exercise- you look great!! ?We'll call you with your Neurology Referral ?Call with any questions or concerns ?Stay Safe!  Stay Healthy! ?Happy [place]!!  Safe [place]!!! ?

## 2022-04-17 ENCOUNTER — Other Ambulatory Visit: Payer: Self-pay | Admitting: Family Medicine

## 2022-04-17 DIAGNOSIS — Z1231 Encounter for screening mammogram for malignant neoplasm of breast: Secondary | ICD-10-CM

## 2022-06-02 ENCOUNTER — Ambulatory Visit
Admission: RE | Admit: 2022-06-02 | Discharge: 2022-06-02 | Disposition: A | Discharge status: Home / Self Care | Payer: Medicare Other | Source: Ambulatory Visit | Attending: Family Medicine | Admitting: Family Medicine

## 2022-06-02 DIAGNOSIS — Z1231 Encounter for screening mammogram for malignant neoplasm of breast: Secondary | ICD-10-CM

## 2022-07-30 ENCOUNTER — Ambulatory Visit (INDEPENDENT_AMBULATORY_CARE_PROVIDER_SITE_OTHER): Payer: Medicare Other | Admitting: Family Medicine

## 2022-07-30 ENCOUNTER — Encounter: Payer: Self-pay | Admitting: Family Medicine

## 2022-07-30 VITALS — BP 112/70 | HR 80 | Temp 97.1°F | Resp 16 | Ht 64.5 in | Wt 136.0 lb

## 2022-07-30 DIAGNOSIS — M79672 Pain in left foot: Secondary | ICD-10-CM

## 2022-07-30 NOTE — Progress Notes (Unsigned)
   Subjective:    Patient ID: Kristi Stark, female    DOB: Dec 21, 1955, 66 y.o.   MRN: [id number]  HPI Foot pain- pt reports sxs started 10-11 months ago.  Thinks pain started after running on treadmill.  Went to [employer] for arch supports.  Wears them pretty consistently along w/ tight sleeve and sneakers.  L foot.  Pain is medial along arch.     Review of Systems For ROS see HPI     Objective:   Physical Exam Vitals reviewed.  Constitutional:      General: She is not in acute distress.    Appearance: Normal appearance. She is not ill-appearing.  HENT:     Head: Normocephalic and atraumatic.  Cardiovascular:     Pulses: Normal pulses.  Musculoskeletal:        General: Deformity (medial projection of L talus) present. No swelling.     Comments: Hindfoot valgus bilaterally  Skin:    General: Skin is warm and dry.     Findings: No bruising or erythema.  Neurological:     Mental Status: She is alert and oriented to person, place, and time.  Psychiatric:        Mood and Affect: Mood normal.        Behavior: Behavior normal.        Thought Content: Thought content normal.           Assessment & Plan:   L foot pain- new to provider, ongoing for pt.  She has Parkinson's so she tries to remain very active and this is causing her problems.  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  Will refer to foot and ankle specialist.  Pt expressed understanding and is in agreement w/ plan.

## 2022-07-30 NOTE — Patient Instructions (Signed)
Follow up as needed or as scheduled ICE for pain Continue the arch support and sleeve for comfort Call with any questions or concerns Stay Safe!  Stay Healthy! Happy [place]!!!

## 2022-08-14 ENCOUNTER — Ambulatory Visit (INDEPENDENT_AMBULATORY_CARE_PROVIDER_SITE_OTHER): Payer: Medicare Other

## 2022-08-14 VITALS — Ht 63.0 in | Wt 135.0 lb

## 2022-08-14 DIAGNOSIS — Z0000 Encounter for general adult medical examination without abnormal findings: Secondary | ICD-10-CM | POA: Diagnosis not present

## 2022-08-14 NOTE — Progress Notes (Signed)
Subjective:   Kristi Stark is a 66 y.o. female who presents for Medicare Annual (Subsequent) preventive examination.   I connected with  Kristi Stark on 08/14/22 by a audio enabled telemedicine application and verified that I am speaking with the correct person using two identifiers.  Patient Location: Home  Provider Location: Home Office  I discussed the limitations of evaluation and management by telemedicine. The patient expressed understanding and agreed to proceed.  Review of Systems     Cardiac Risk Factors include: advanced age (>36men, >31 women)     Objective:    Today's Vitals   08/14/22 1400  Weight: 135 lb (61.2 kg)  Height: 5\' 3"  (1.6 m)   Body mass index is 23.91 kg/m.     08/14/2022    2:03 PM  Advanced Directives  Does Patient Have a Medical Advance Directive? Yes  Type of 08/16/2022 of [place];Living will  Copy of Healthcare Power of Attorney in Chart? No - copy requested    Current Medications (verified) Outpatient Encounter Medications as of 08/14/2022  Medication Sig   COD LIVER OIL PO Take by mouth.   Multiple Vitamins-Minerals (ZINC PO) Take by mouth.   Red Yeast Rice Extract 600 MG TABS Take 1 tablet by mouth daily.   rizatriptan (MAXALT-MLT) 5 MG disintegrating tablet PRN   Vitamin D-Vitamin K (K2 PLUS D3 PO) Take by mouth.   Zinc Acetate 50 MG CAPS Take 1 capsule by mouth daily.   No facility-administered encounter medications on file as of 08/14/2022.    Allergies (verified) Patient has no known allergies.   History: Past Medical History:  Diagnosis Date   Migraine    Parkinson's disease    Past Surgical History:  Procedure Laterality Date   APPENDECTOMY     BASAL CELL CARCINOMA EXCISION     TONSILLECTOMY     Family History  Problem Relation Age of Onset   Cancer Mother    Breast cancer Mother 48   Heart disease Father    Healthy Sister    Healthy Daughter    Healthy Brother     Healthy Son    Social History   Socioeconomic History   Marital status: Married    Spouse name: Not on file   Number of children: Not on file   Years of education: Not on file   Highest education level: Not on file  Occupational History   Not on file  Tobacco Use   Smoking status: Never   Smokeless tobacco: Never  Vaping Use   Vaping Use: Never used  Substance and Sexual Activity   Alcohol use: Yes    Comment: wine every 2 weeks   Drug use: Never   Sexual activity: Yes  Other Topics Concern   Not on file  Social History Narrative   Not on file   Social Determinants of Health   Financial Resource Strain: Low Risk  (08/14/2022)   Overall Financial Resource Strain (CARDIA)    Difficulty of Paying Living Expenses: Not hard at all  Food Insecurity: No Food Insecurity (08/14/2022)   Hunger Vital Sign    Worried About Running Out of Food in the Last Year: Never true    Ran Out of Food in the Last Year: Never true  Transportation Needs: No Transportation Needs (08/14/2022)   PRAPARE - 08/16/2022 (Medical): No    Lack of Transportation (Non-Medical): No  Physical Activity: Sufficiently Active (08/14/2022)  Exercise Vital Sign    Days of Exercise per Week: 5 days    Minutes of Exercise per Session: 60 min  Stress: No Stress Concern Present (08/14/2022)   [place]    Feeling of Stress : Not at all  Social Connections: [place] (08/14/2022)   Social Connection and Isolation Panel [NHANES]    Frequency of Communication with Friends and Family: More than three times a week    Frequency of Social Gatherings with Friends and Family: More than three times a week    Attends Religious Services: More than 4 times per year    Active Member of [employer] or Organizations: Yes    Attends Music therapist: More than 4 times per year    Marital Status: Married     Tobacco Counseling Counseling given: Not Answered   Clinical Intake:                 Diabetic?no          Activities of Daily Living    08/14/2022    2:04 PM 01/01/2022    8:04 AM  In your present state of health, do you have any difficulty performing the following activities:  Hearing? 0 0  Vision? 0 0  Difficulty concentrating or making decisions? 0 0  Walking or climbing stairs? 0 0  Dressing or bathing? 0 0  Doing errands, shopping? 0 0  Preparing Food and eating ? N   Using the Toilet? N   In the past six months, have you accidently leaked urine? N   Do you have problems with loss of bowel control? N   Managing your Medications? N   Managing your Finances? N   Housekeeping or managing your Housekeeping? N     Patient Care Team: Midge Minium, MD as PCP - General (Family Medicine) Tat, Eustace Quail, DO as Consulting Physician (Neurology) Rolm Bookbinder, MD as Consulting Physician (Dermatology)  Indicate any recent Medical Services you may have received from other than [facility] providers in the past year (date may be approximate).     Assessment:   This is a routine wellness examination for Kristi Stark.  Hearing/Vision screen Vision Screening - Comments:: Annual eye exams wear glasses   Dietary issues and exercise activities discussed: Current Exercise Habits: Home exercise routine, Type of exercise: walking, Time (Minutes): 30, Frequency (Times/Week): 5, Weekly Exercise (Minutes/Week): 150, Intensity: Mild, Exercise limited by: None identified   Goals Addressed             This Visit's Progress    DIET - INCREASE WATER INTAKE         Depression Screen    08/14/2022    2:02 PM 07/30/2022    1:44 PM 01/01/2022    8:07 AM 12/02/2021   11:06 AM 12/31/2020    8:01 AM 12/29/2019    8:11 AM 12/28/2018    8:01 AM  PHQ 2/9 Scores  PHQ - 2 Score 0 0 0 0 0 0 0  PHQ- 9 Score 0 1 0 2  0 0    Fall Risk    08/14/2022    2:01 PM 07/30/2022    1:44 PM  01/01/2022    8:07 AM 12/02/2021   11:06 AM 12/31/2020    8:02 AM  Fall Risk   Falls in the past year? 0 0 0 0 0  Number falls in past yr: 0   0 0  Injury with  Fall? 0   0 0  Risk for fall due to : No Fall Risks No Fall Risks No Fall Risks No Fall Risks   Follow up Falls prevention discussed Falls evaluation completed Falls evaluation completed Falls evaluation completed Falls evaluation completed    FALL RISK PREVENTION PERTAINING TO THE HOME:  Any stairs in or around the home? Yes  If so, are there any without handrails? No  Home free of loose throw rugs in walkways, pet beds, electrical cords, etc? Yes  Adequate lighting in your home to reduce risk of falls? Yes   ASSISTIVE DEVICES UTILIZED TO PREVENT FALLS:  Life alert? No  Use of a cane, walker or w/c? No  Grab bars in the bathroom? Yes  Shower chair or bench in shower? Yes  Elevated toilet seat or a handicapped toilet? Yes        08/14/2022    2:04 PM  6CIT Screen  What Year? 0 points  What month? 0 points  What time? 0 points  Count back from 20 0 points  Months in reverse 0 points  Repeat phrase 0 points  Total Score 0 points    Immunizations Immunization History  Administered Date(s) Administered   Fluad Quad(high Dose 65+) 08/09/2020, 08/15/2021   Influenza,inj,Quad PF,6+ Mos 08/11/2018   PFIZER(Purple Top)SARS-COV-2 Vaccination 01/09/2020, 01/31/2020, 08/28/2020   Pneumococcal Conjugate-13 12/31/2020   Tdap 12/28/2018    TDAP status: Up to date  Flu Vaccine status: Due, Education has been provided regarding the importance of this vaccine. Advised may receive this vaccine at local pharmacy or Health Dept. Aware to provide a copy of the vaccination record if obtained from local pharmacy or Health Dept. Verbalized acceptance and understanding.  Pneumococcal vaccine status: Up to date  Covid-19 vaccine status: Completed vaccines  Qualifies for Shingles Vaccine? Yes   Zostavax completed No   Shingrix  Completed?: No.    Education has been provided regarding the importance of this vaccine. Patient has been advised to call insurance company to determine out of pocket expense if they have not yet received this vaccine. Advised may also receive vaccine at local pharmacy or Health Dept. Verbalized acceptance and understanding.  Screening Tests Health Maintenance  Topic Date Due   INFLUENZA VACCINE  01/25/2023 (Originally 05/27/2022)   Pneumonia Vaccine 39+ Years old (2 - PPSV23 or PCV20) 07/31/2023 (Originally 12/31/2021)   MAMMOGRAM  06/03/2023   COLONOSCOPY (Pts 45-52yrs Insurance coverage will need to be confirmed)  12/26/2026   TETANUS/TDAP  12/27/2028   DEXA SCAN  Completed   Hepatitis C Screening  Completed   HPV VACCINES  Aged Out   COVID-19 Vaccine  Discontinued   Zoster Vaccines- Shingrix  Discontinued    Health Maintenance  There are no preventive care reminders to display for this patient.  Colorectal cancer screening: Type of screening: Colonoscopy. Completed 12/25/2016. Repeat every 10 years  Mammogram status: Completed 06/02/2022. Repeat every year  Bone Density status: Ordered Declines at this time will discuss with PCP . Pt provided with contact info and advised to call to schedule appt.  Lung Cancer Screening: (Low Dose CT Chest recommended if Age 19-80 years, 30 pack-year currently smoking OR have quit w/in 15years.) does not qualify.   Lung Cancer Screening Referral: n/a  Additional Screening:  Hepatitis C Screening: does not qualify;  Vision Screening: Recommended annual ophthalmology exams for early detection of glaucoma and other disorders of the eye. Is the patient up to date with their annual eye exam?  Yes  Who is the provider or what is the name of the office in which the patient attends annual eye exams? Dr.Miller  If pt is not established with a provider, would they like to be referred to a provider to establish care? No .   Dental Screening: Recommended  annual dental exams for proper oral hygiene  Community Resource Referral / Chronic Care Management: CRR required this visit?  No   CCM required this visit?  No      Plan:     I have personally reviewed and noted the following in the patient's chart:   Medical and social history Use of alcohol, tobacco or illicit drugs  Current medications and supplements including opioid prescriptions. Patient is not currently taking opioid prescriptions. Functional ability and status Nutritional status Physical activity Advanced directives List of other physicians Hospitalizations, surgeries, and ER visits in previous 12 months Vitals Screenings to include cognitive, depression, and falls Referrals and appointments  In addition, I have reviewed and discussed with patient certain preventive protocols, quality metrics, and best practice recommendations. A written personalized care plan for preventive services as well as general preventive health recommendations were provided to patient.     Lorrene Reid, LPN   [id number]   Nurse Notes: Patient to discuss Dexa with PCP

## 2022-08-14 NOTE — Patient Instructions (Signed)
[name] , Thank you for taking time to come for your Medicare Wellness Visit. I appreciate your ongoing commitment to your health goals. Please review the following plan we discussed and let me know if I can assist you in the future.   These are the goals we discussed:  Goals      DIET - INCREASE WATER INTAKE        This is a list of the screening recommended for you and due dates:  Health Maintenance  Topic Date Due   Flu Shot  01/25/2023*   Pneumonia Vaccine (2 - PPSV23 or PCV20) 07/31/2023*   Mammogram  06/03/2023   Colon Cancer Screening  12/26/2026   Tetanus Vaccine  12/27/2028   DEXA scan (bone density measurement)  Completed   Hepatitis C Screening: USPSTF Recommendation to screen - Ages 67-67 yo.  Completed   HPV Vaccine  Aged Out   COVID-19 Vaccine  Discontinued   Zoster (Shingles) Vaccine  Discontinued  *Topic was postponed. The date shown is not the original due date.    Advanced directives: Please bring a copy of your health care power of attorney and living will to the office to be added to your chart at your convenience.   Conditions/risks identified:Aim for 30 minutes of exercise or brisk walking, 6-8 glasses of water, and 5 servings of fruits and vegetables each day.   Next appointment: Follow up in one year for your annual wellness visit    Preventive Care 65 Years and Older, Female Preventive care refers to lifestyle choices and visits with your health care provider that can promote health and wellness. What does preventive care include? A yearly physical exam. This is also called an annual well check. Dental exams once or twice a year. Routine eye exams. Ask your health care provider how often you should have your eyes checked. Personal lifestyle choices, including: Daily care of your teeth and gums. Regular physical activity. Eating a healthy diet. Avoiding tobacco and drug use. Limiting alcohol use. Practicing safe sex. Taking low-dose aspirin  every day. Taking vitamin and mineral supplements as recommended by your health care provider. What happens during an annual well check? The services and screenings done by your health care provider during your annual well check will depend on your age, overall health, lifestyle risk factors, and family history of disease. Counseling  Your health care provider may ask you questions about your: Alcohol use. Tobacco use. Drug use. Emotional well-being. Home and relationship well-being. Sexual activity. Eating habits. History of falls. Memory and ability to understand (cognition). Work and work Statistician. Reproductive health. Screening  You may have the following tests or measurements: Height, weight, and BMI. Blood pressure. Lipid and cholesterol levels. These may be checked every 5 years, or more frequently if you are over 45 years old. Skin check. Lung cancer screening. You may have this screening every year starting at age 67 if you have a 30-pack-year history of smoking and currently smoke or have quit within the past 15 years. Fecal occult blood test (FOBT) of the stool. You may have this test every year starting at age 67. Flexible sigmoidoscopy or colonoscopy. You may have a sigmoidoscopy every 5 years or a colonoscopy every 10 years starting at age 75. Hepatitis C blood test. Hepatitis B blood test. Sexually transmitted disease (STD) testing. Diabetes screening. This is done by checking your blood sugar (glucose) after you have not eaten for a while (fasting). You may have this done every 1-3  years. Bone density scan. This is done to screen for osteoporosis. You may have this done starting at age 67. Mammogram. This may be done every 1-2 years. Talk to your health care provider about how often you should have regular mammograms. Talk with your health care provider about your test results, treatment options, and if necessary, the need for more tests. Vaccines  Your health  care provider may recommend certain vaccines, such as: Influenza vaccine. This is recommended every year. Tetanus, diphtheria, and acellular pertussis (Tdap, Td) vaccine. You may need a Td booster every 10 years. Zoster vaccine. You may need this after age 67. Pneumococcal 13-valent conjugate (PCV13) vaccine. One dose is recommended after age 67. Pneumococcal polysaccharide (PPSV23) vaccine. One dose is recommended after age 67. Talk to your health care provider about which screenings and vaccines you need and how often you need them. This information is not intended to replace advice given to you by your health care provider. Make sure you discuss any questions you have with your health care provider. Document Released: 11/09/2015 Document Revised: 07/02/2016 Document Reviewed: 08/14/2015 Elsevier Interactive Patient Education  2017 [employer] Prevention in the Home Falls can cause injuries. They can happen to people of all ages. There are many things you can do to make your home safe and to help prevent falls. What can I do on the outside of my home? Regularly fix the edges of walkways and driveways and fix any cracks. Remove anything that might make you trip as you walk through a door, such as a raised step or threshold. Trim any bushes or trees on the path to your home. Use bright outdoor lighting. Clear any walking paths of anything that might make someone trip, such as rocks or tools. Regularly check to see if handrails are loose or broken. Make sure that both sides of any steps have handrails. Any raised decks and porches should have guardrails on the edges. Have any leaves, snow, or ice cleared regularly. Use sand or salt on walking paths during winter. Clean up any spills in your garage right away. This includes oil or grease spills. What can I do in the bathroom? Use night lights. Install grab bars by the toilet and in the tub and shower. Do not use towel bars as grab  bars. Use non-skid mats or decals in the tub or shower. If you need to sit down in the shower, use a plastic, non-slip stool. Keep the floor dry. Clean up any water that spills on the floor as soon as it happens. Remove soap buildup in the tub or shower regularly. Attach bath mats securely with double-sided non-slip rug tape. Do not have throw rugs and other things on the floor that can make you trip. What can I do in the bedroom? Use night lights. Make sure that you have a light by your bed that is easy to reach. Do not use any sheets or blankets that are too big for your bed. They should not hang down onto the floor. Have a firm chair that has side arms. You can use this for support while you get dressed. Do not have throw rugs and other things on the floor that can make you trip. What can I do in the kitchen? Clean up any spills right away. Avoid walking on wet floors. Keep items that you use a lot in easy-to-reach places. If you need to reach something above you, use a strong step stool that has a grab  bar. Keep electrical cords out of the way. Do not use floor polish or wax that makes floors slippery. If you must use wax, use non-skid floor wax. Do not have throw rugs and other things on the floor that can make you trip. What can I do with my stairs? Do not leave any items on the stairs. Make sure that there are handrails on both sides of the stairs and use them. Fix handrails that are broken or loose. Make sure that handrails are as long as the stairways. Check any carpeting to make sure that it is firmly attached to the stairs. Fix any carpet that is loose or worn. Avoid having throw rugs at the top or bottom of the stairs. If you do have throw rugs, attach them to the floor with carpet tape. Make sure that you have a light switch at the top of the stairs and the bottom of the stairs. If you do not have them, ask someone to add them for you. What else can I do to help prevent  falls? Wear shoes that: Do not have high heels. Have rubber bottoms. Are comfortable and fit you well. Are closed at the toe. Do not wear sandals. If you use a stepladder: Make sure that it is fully opened. Do not climb a closed stepladder. Make sure that both sides of the stepladder are locked into place. Ask someone to hold it for you, if possible. Clearly mark and make sure that you can see: Any grab bars or handrails. First and last steps. Where the edge of each step is. Use tools that help you move around (mobility aids) if they are needed. These include: Canes. Walkers. Scooters. Crutches. Turn on the lights when you go into a dark area. Replace any light bulbs as soon as they burn out. Set up your furniture so you have a clear path. Avoid moving your furniture around. If any of your floors are uneven, fix them. If there are any pets around you, be aware of where they are. Review your medicines with your doctor. Some medicines can make you feel dizzy. This can increase your chance of falling. Ask your doctor what other things that you can do to help prevent falls. This information is not intended to replace advice given to you by your health care provider. Make sure you discuss any questions you have with your health care provider. Document Released: 08/09/2009 Document Revised: 03/20/2016 Document Reviewed: 11/17/2014 Elsevier Interactive Patient Education  2017 [employer].

## 2023-01-07 ENCOUNTER — Encounter: Payer: Self-pay | Admitting: Family Medicine

## 2023-01-07 ENCOUNTER — Ambulatory Visit: Payer: Medicare Other | Admitting: Family Medicine

## 2023-01-07 VITALS — BP 116/80 | HR 69 | Temp 97.6°F | Resp 19 | Ht 63.0 in | Wt 132.5 lb

## 2023-01-07 DIAGNOSIS — E785 Hyperlipidemia, unspecified: Secondary | ICD-10-CM

## 2023-01-07 DIAGNOSIS — G20A1 Parkinson's disease without dyskinesia, without mention of fluctuations: Secondary | ICD-10-CM | POA: Diagnosis not present

## 2023-01-07 LAB — CBC WITH DIFFERENTIAL/PLATELET
Basophils Absolute: 0 10*3/uL (ref 0.0–0.1)
Basophils Relative: 0.7 % (ref 0.0–3.0)
Eosinophils Absolute: 0.1 10*3/uL (ref 0.0–0.7)
Eosinophils Relative: 2.2 % (ref 0.0–5.0)
HCT: 43.3 % (ref 36.0–46.0)
Hemoglobin: 14.7 g/dL (ref 12.0–15.0)
Lymphocytes Relative: 46.8 % — ABNORMAL HIGH (ref 12.0–46.0)
Lymphs Abs: 2.5 10*3/uL (ref 0.7–4.0)
MCHC: 33.9 g/dL (ref 30.0–36.0)
MCV: 93.1 fl (ref 78.0–100.0)
Monocytes Absolute: 0.4 10*3/uL (ref 0.1–1.0)
Monocytes Relative: 7.8 % (ref 3.0–12.0)
Neutro Abs: 2.3 10*3/uL (ref 1.4–7.7)
Neutrophils Relative %: 42.5 % — ABNORMAL LOW (ref 43.0–77.0)
Platelets: 228 10*3/uL (ref 150.0–400.0)
RBC: 4.65 Mil/uL (ref 3.87–5.11)
RDW: 12.6 % (ref 11.5–15.5)
WBC: 5.4 10*3/uL (ref 4.0–10.5)

## 2023-01-07 LAB — BASIC METABOLIC PANEL
BUN: 15 mg/dL (ref 6–23)
CO2: 28 mEq/L (ref 19–32)
Calcium: 10 mg/dL (ref 8.4–10.5)
Chloride: 103 mEq/L (ref 96–112)
Creatinine, Ser: 0.96 mg/dL (ref 0.40–1.20)
GFR: 61.22 mL/min (ref >60.00)
Glucose, Bld: 90 mg/dL (ref 70–99)
Potassium: 4.1 mEq/L (ref 3.5–5.1)
Sodium: 138 mEq/L (ref 135–145)

## 2023-01-07 LAB — HEPATIC FUNCTION PANEL
ALT: 23 U/L (ref 0–35)
AST: 30 U/L (ref 0–37)
Albumin: 4.4 g/dL (ref 3.5–5.2)
Alkaline Phosphatase: 81 U/L (ref 39–117)
Bilirubin, Direct: 0.1 mg/dL (ref 0.0–0.3)
Total Bilirubin: 0.7 mg/dL (ref 0.2–1.2)
Total Protein: 7.3 g/dL (ref 6.0–8.3)

## 2023-01-07 LAB — LIPID PANEL
Cholesterol: 242 mg/dL — ABNORMAL HIGH (ref 0–200)
HDL: 78.6 mg/dL (ref >39.00)
LDL Cholesterol: 142 mg/dL — ABNORMAL HIGH (ref 0–99)
NonHDL: 163.51
Total CHOL/HDL Ratio: 3
Triglycerides: 107 mg/dL (ref 0.0–149.0)
VLDL: 21.4 mg/dL (ref 0.0–40.0)

## 2023-01-07 LAB — TSH: TSH: 5.14 u[IU]/mL (ref 0.35–5.50)

## 2023-01-07 NOTE — Progress Notes (Signed)
   Subjective:    Patient ID: Kristi Stark, female    DOB: 10-02-1956, 67 y.o.   MRN: [id number]  HPI Hyperlipidemia- ongoing issue.  Currently on Red Yeast Rice.  Last LDL 154.  No CP, SOB, abd pain, N/V.  Continues to exercise- 3x/week does cardio, 2x/week strength training  Parkinson's- following w/ [facility].  Currently on Amantadine 100mg  BID and Ropinirole 4mg  QHS.  In process of adjusting Ropinirole dose based on level of efficacy.  Pt reports feeling good.  Tremors of both hands, L>R.   Review of Systems For ROS see HPI     Objective:   Physical Exam Vitals reviewed.  Constitutional:      General: She is not in acute distress.    Appearance: Normal appearance. She is well-developed. She is not ill-appearing.  HENT:     Head: Normocephalic and atraumatic.  Eyes:     Conjunctiva/sclera: Conjunctivae normal.     Pupils: Pupils are equal, round, and reactive to light.  Neck:     Thyroid: No thyromegaly.  Cardiovascular:     Rate and Rhythm: Normal rate and regular rhythm.     Pulses: Normal pulses.     Heart sounds: Normal heart sounds. No murmur heard. Pulmonary:     Effort: Pulmonary effort is normal. No respiratory distress.     Breath sounds: Normal breath sounds.  Abdominal:     General: There is no distension.     Palpations: Abdomen is soft.     Tenderness: There is no abdominal tenderness.  Musculoskeletal:     Cervical back: Normal range of motion and neck supple.     Right lower leg: No edema.     Left lower leg: No edema.  Lymphadenopathy:     Cervical: No cervical adenopathy.  Skin:    General: Skin is warm and dry.  Neurological:     General: No focal deficit present.     Mental Status: She is alert and oriented to person, place, and time.     Coordination: Coordination abnormal (bilateral hand tremors, L>R).  Psychiatric:        Mood and Affect: Mood normal.        Behavior: Behavior normal.        Thought Content: Thought content normal.            Assessment & Plan:

## 2023-01-07 NOTE — Assessment & Plan Note (Signed)
Chronic problem.  Currently on Red Yeast Rice.  Check labs.  Adjust meds prn

## 2023-01-07 NOTE — Assessment & Plan Note (Signed)
Ongoing issue for pt.  Following w/ [facility].  Recently started Ropinirole and is in the process of adjusting the dose.  Also on Amantadine.  Will continue to follow.

## 2023-01-07 NOTE — Patient Instructions (Signed)
Follow up in 1 year or as needed We'll notify you of your lab results and make any changes if needed Keep up the good work on healthy diet and regular exercise- you look great! Call with any questions or concerns Stay Safe!  Stay Healthy!! 

## 2023-01-08 ENCOUNTER — Telehealth: Payer: Self-pay

## 2023-01-08 NOTE — Telephone Encounter (Signed)
Left lab results on pt VM 

## 2023-01-08 NOTE — Telephone Encounter (Signed)
-----   Message from Midge Minium, MD sent at 01/08/2023  7:27 AM EDT ----- Labs look great!  No changes at this time

## 2023-04-21 ENCOUNTER — Other Ambulatory Visit: Payer: Self-pay | Admitting: Family Medicine

## 2023-04-21 DIAGNOSIS — Z1231 Encounter for screening mammogram for malignant neoplasm of breast: Secondary | ICD-10-CM

## 2023-06-04 ENCOUNTER — Ambulatory Visit
Admission: RE | Admit: 2023-06-04 | Discharge: 2023-06-04 | Disposition: A | Discharge status: Home / Self Care | Payer: Medicare Other | Source: Ambulatory Visit | Attending: Family Medicine | Admitting: Family Medicine

## 2023-06-04 DIAGNOSIS — Z1231 Encounter for screening mammogram for malignant neoplasm of breast: Secondary | ICD-10-CM

## 2023-08-07 ENCOUNTER — Ambulatory Visit: Payer: Medicare Other

## 2023-08-07 DIAGNOSIS — Z23 Encounter for immunization: Secondary | ICD-10-CM | POA: Diagnosis not present

## 2023-08-07 NOTE — Progress Notes (Signed)
Kristi Stark is a 67 y.o. female presents to the office today for Influenza:TDap injections.  Patient was made aware of potential none coverage of the Tetanus vaccine and provided information about pharmacy but preferred to have done here. ABN signed.      Kristi Stark Kristi Stark

## 2024-01-11 ENCOUNTER — Encounter: Payer: Self-pay | Admitting: Family Medicine

## 2024-01-11 ENCOUNTER — Ambulatory Visit (INDEPENDENT_AMBULATORY_CARE_PROVIDER_SITE_OTHER): Payer: Medicare Other | Admitting: Family Medicine

## 2024-01-11 VITALS — BP 114/60 | HR 66 | Temp 98.6°F | Ht 63.0 in | Wt 141.2 lb

## 2024-01-11 DIAGNOSIS — E785 Hyperlipidemia, unspecified: Secondary | ICD-10-CM | POA: Diagnosis not present

## 2024-01-11 DIAGNOSIS — E663 Overweight: Secondary | ICD-10-CM | POA: Diagnosis not present

## 2024-01-11 DIAGNOSIS — G20A1 Parkinson's disease without dyskinesia, without mention of fluctuations: Secondary | ICD-10-CM | POA: Diagnosis not present

## 2024-01-11 LAB — BASIC METABOLIC PANEL
BUN: 17 mg/dL (ref 6–23)
CO2: 27 meq/L (ref 19–32)
Calcium: 10 mg/dL (ref 8.4–10.5)
Chloride: 104 meq/L (ref 96–112)
Creatinine, Ser: 0.87 mg/dL (ref 0.40–1.20)
GFR: 68.41 mL/min (ref >60.00)
Glucose, Bld: 79 mg/dL (ref 70–99)
Potassium: 3.9 meq/L (ref 3.5–5.1)
Sodium: 139 meq/L (ref 135–145)

## 2024-01-11 LAB — LIPID PANEL
Cholesterol: 212 mg/dL — ABNORMAL HIGH (ref 0–200)
HDL: 64.2 mg/dL (ref >39.00)
LDL Cholesterol: 124 mg/dL — ABNORMAL HIGH (ref 0–99)
NonHDL: 147.8
Total CHOL/HDL Ratio: 3
Triglycerides: 117 mg/dL (ref 0.0–149.0)
VLDL: 23.4 mg/dL (ref 0.0–40.0)

## 2024-01-11 LAB — HEPATIC FUNCTION PANEL
ALT: 24 U/L (ref 0–35)
AST: 27 U/L (ref 0–37)
Albumin: 4.3 g/dL (ref 3.5–5.2)
Alkaline Phosphatase: 85 U/L (ref 39–117)
Bilirubin, Direct: 0.1 mg/dL (ref 0.0–0.3)
Total Bilirubin: 0.5 mg/dL (ref 0.2–1.2)
Total Protein: 7.3 g/dL (ref 6.0–8.3)

## 2024-01-11 LAB — CBC WITH DIFFERENTIAL/PLATELET
Basophils Absolute: 0.1 10*3/uL (ref 0.0–0.1)
Basophils Relative: 1.1 % (ref 0.0–3.0)
Eosinophils Absolute: 0.4 10*3/uL (ref 0.0–0.7)
Eosinophils Relative: 6.2 % — ABNORMAL HIGH (ref 0.0–5.0)
HCT: 42.8 % (ref 36.0–46.0)
Hemoglobin: 14.4 g/dL (ref 12.0–15.0)
Lymphocytes Relative: 45 % (ref 12.0–46.0)
Lymphs Abs: 2.8 10*3/uL (ref 0.7–4.0)
MCHC: 33.6 g/dL (ref 30.0–36.0)
MCV: 93.5 fl (ref 78.0–100.0)
Monocytes Absolute: 0.5 10*3/uL (ref 0.1–1.0)
Monocytes Relative: 8.7 % (ref 3.0–12.0)
Neutro Abs: 2.4 10*3/uL (ref 1.4–7.7)
Neutrophils Relative %: 39 % — ABNORMAL LOW (ref 43.0–77.0)
Platelets: 212 10*3/uL (ref 150.0–400.0)
RBC: 4.58 Mil/uL (ref 3.87–5.11)
RDW: 12.4 % (ref 11.5–15.5)
WBC: 6.1 10*3/uL (ref 4.0–10.5)

## 2024-01-11 LAB — TSH: TSH: 4.07 u[IU]/mL (ref 0.35–5.50)

## 2024-01-11 NOTE — Assessment & Plan Note (Signed)
 Chronic problem.  On Red Yeast Rice and exercising regularly.  Applauded her efforts.  Will continue to follow.

## 2024-01-11 NOTE — Assessment & Plan Note (Signed)
 New.  Pt has gained 8 lbs since last visit.  She is eating well and exercising regularly.  Applauded her efforts.  Will continue to follow.

## 2024-01-11 NOTE — Patient Instructions (Signed)
Follow up in 1 year or as needed We'll notify you of your lab results and make any changes if needed Keep up the good work on healthy diet and regular exercise- you look great!!! Call with any questions or concerns Stay Safe!  Stay Healthy! Happy Spring!!! 

## 2024-01-11 NOTE — Assessment & Plan Note (Signed)
 Ongoing issue for pt.  Following w/ [facility].  Currently on Amantadine and Ropinirole and doing well.  Pt reports feeling great.  Will continue to follow along.

## 2024-01-11 NOTE — Progress Notes (Signed)
   Subjective:    Patient ID: Kristi Stark, female    DOB: [birth year], 68 y.o.   MRN: [id number]  HPI Hyperlipidemia- ongoing issue.  Currently on Red Yeast Rice 1200mg  daily.  Pt reports feeling 'great'.  Denies CP, SOB, abd pain, N/V  Overweight- pt has gained 8 lbs since last year.  BMI just crossed above 25 at 25.02.  continues to exercise 5 days/week- 3 cardio, 2 weights.    Parkinson's- ongoing issue.  Follows w/ Neuro at [facility].  Currently on Amantadine 100mg  TID and Ropinirole 4mg  BID.  Last appt was Feb and MD was very pleased w/ how things are going.  Health Maintenance- pt declines PNA and shingles vaccine, UTD on mammo, colonoscopy   Review of Systems For ROS see HPI     Objective:   Physical Exam General Appearance:    Alert, cooperative, no distress, appears stated age  Head:    Normocephalic, without obvious abnormality, atraumatic  Eyes:    PERRL, conjunctiva/corneas clear, EOM's intact both eyes  Ears:    Normal TM's and external ear canals, both ears  Nose:   Nares normal, septum midline, mucosa normal, no drainage    or sinus tenderness  Throat:   Lips, mucosa, and tongue normal; teeth and gums normal  Neck:   Supple, symmetrical, trachea midline, no adenopathy;    Thyroid: no enlargement/tenderness/nodules  Back:     Symmetric, no curvature, ROM normal, no CVA tenderness  Lungs:     Clear to auscultation bilaterally, respirations unlabored  Chest Wall:    No tenderness or deformity   Heart:    Regular rate and rhythm, S1 and S2 normal, no murmur, rub   or gallop  Breast Exam:    Deferred to mammo  Abdomen:     Soft, non-tender, bowel sounds active all four quadrants,    no masses, no organomegaly  Genitalia:    Deferred   Rectal:    Extremities:   Extremities normal, atraumatic, no cyanosis or edema  Pulses:   2+ and symmetric all extremities  Skin:   Skin color, texture, turgor normal, no rashes or lesions  Lymph nodes:   Cervical, supraclavicular, and  axillary nodes normal  Neurologic:   CNII-XII intact, normal strength, sensation and reflexes    throughout          Assessment & Plan:

## 2024-01-12 ENCOUNTER — Encounter: Payer: Self-pay | Admitting: Family Medicine

## 2024-01-12 NOTE — Telephone Encounter (Signed)
 Lab results have been discussed.   Verbalized understanding? Yes  Are there any questions? No      Patient would like to thank Dr.Tabori for being so nice and doing an amazing job!

## 2024-01-12 NOTE — Telephone Encounter (Signed)
-----   Message from Neena Rhymes sent at 01/12/2024 11:29 AM EDT ----- Your cholesterol looks MUCH better!!  Keep up the good work!  Remainder of labs look great!  (Please note- I may not comment on every lab value that is noted as abnormal.  Trust when I say I review all of them, compare them to previous values, and put them in the correct medical context.  If I have any concerns- or any follow up or changes are needed- I promise to let you know)

## 2024-04-25 ENCOUNTER — Other Ambulatory Visit: Payer: Self-pay | Admitting: Family Medicine

## 2024-04-25 DIAGNOSIS — Z1231 Encounter for screening mammogram for malignant neoplasm of breast: Secondary | ICD-10-CM

## 2024-06-07 ENCOUNTER — Ambulatory Visit
Admission: RE | Admit: 2024-06-07 | Discharge: 2024-06-07 | Disposition: A | Discharge status: Home / Self Care | Source: Ambulatory Visit | Attending: Family Medicine | Admitting: Family Medicine

## 2024-06-07 DIAGNOSIS — Z1231 Encounter for screening mammogram for malignant neoplasm of breast: Secondary | ICD-10-CM

## 2025-01-11 ENCOUNTER — Ambulatory Visit: Admitting: Family Medicine
